# Patient Record
Sex: Male | Born: 1945 | Race: Black or African American | Hispanic: No | Marital: Single | Smoking: Current every day smoker
Health system: Southern US, Community
[De-identification: ages and names within clinical notes are randomized; demographics above are authoritative.]

## PROBLEM LIST (undated history)

## (undated) DIAGNOSIS — F419 Anxiety disorder, unspecified: Secondary | ICD-10-CM

## (undated) DIAGNOSIS — R531 Weakness: Secondary | ICD-10-CM

## (undated) DIAGNOSIS — M1990 Unspecified osteoarthritis, unspecified site: Secondary | ICD-10-CM

## (undated) DIAGNOSIS — F79 Unspecified intellectual disabilities: Secondary | ICD-10-CM

## (undated) DIAGNOSIS — K635 Polyp of colon: Secondary | ICD-10-CM

## (undated) DIAGNOSIS — K8689 Other specified diseases of pancreas: Secondary | ICD-10-CM

## (undated) DIAGNOSIS — S069X9A Unspecified intracranial injury with loss of consciousness of unspecified duration, initial encounter: Secondary | ICD-10-CM

## (undated) DIAGNOSIS — K56609 Unspecified intestinal obstruction, unspecified as to partial versus complete obstruction: Secondary | ICD-10-CM

## (undated) DIAGNOSIS — D649 Anemia, unspecified: Secondary | ICD-10-CM

## (undated) DIAGNOSIS — K8020 Calculus of gallbladder without cholecystitis without obstruction: Secondary | ICD-10-CM

## (undated) DIAGNOSIS — S069XAA Unspecified intracranial injury with loss of consciousness status unknown, initial encounter: Secondary | ICD-10-CM

## (undated) HISTORY — DX: Unspecified intellectual disabilities: F79

## (undated) HISTORY — DX: Anemia, unspecified: D64.9

## (undated) HISTORY — DX: Anxiety disorder, unspecified: F41.9

## (undated) HISTORY — DX: Unspecified intracranial injury with loss of consciousness status unknown, initial encounter: S06.9XAA

## (undated) HISTORY — DX: Polyp of colon: K63.5

## (undated) HISTORY — DX: Unspecified intestinal obstruction, unspecified as to partial versus complete obstruction: K56.609

## (undated) HISTORY — DX: Calculus of gallbladder without cholecystitis without obstruction: K80.20

## (undated) HISTORY — DX: Unspecified intracranial injury with loss of consciousness of unspecified duration, initial encounter: S06.9X9A

---
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
## 2000-02-01 ENCOUNTER — Emergency Department (HOSPITAL_COMMUNITY): Admission: EM | Admit: 2000-02-01 | Discharge: 2000-02-01 | Payer: Self-pay | Admitting: Emergency Medicine

## 2000-02-10 ENCOUNTER — Emergency Department (HOSPITAL_COMMUNITY): Admission: EM | Admit: 2000-02-10 | Discharge: 2000-02-10 | Payer: Self-pay | Admitting: Emergency Medicine

## 2001-02-05 ENCOUNTER — Emergency Department (HOSPITAL_COMMUNITY): Admission: EM | Admit: 2001-02-05 | Discharge: 2001-02-05 | Payer: Self-pay | Admitting: Emergency Medicine

## 2001-02-05 ENCOUNTER — Encounter: Payer: Self-pay | Admitting: Emergency Medicine

## 2006-10-07 ENCOUNTER — Ambulatory Visit (HOSPITAL_COMMUNITY): Admission: RE | Admit: 2006-10-07 | Discharge: 2006-10-07 | Payer: Self-pay | Admitting: Emergency Medicine

## 2006-10-07 ENCOUNTER — Emergency Department (HOSPITAL_COMMUNITY): Admission: EM | Admit: 2006-10-07 | Discharge: 2006-10-07 | Payer: Self-pay | Admitting: Emergency Medicine

## 2006-10-07 ENCOUNTER — Encounter: Payer: Self-pay | Admitting: Vascular Surgery

## 2009-11-20 HISTORY — PX: COLON SURGERY: SHX602

## 2010-06-01 ENCOUNTER — Emergency Department (HOSPITAL_COMMUNITY): Admission: EM | Admit: 2010-06-01 | Discharge: 2010-06-01 | Payer: Self-pay | Admitting: Emergency Medicine

## 2010-07-08 ENCOUNTER — Encounter: Admission: RE | Admit: 2010-07-08 | Discharge: 2010-07-08 | Payer: Self-pay | Admitting: Family Medicine

## 2010-08-03 ENCOUNTER — Encounter (INDEPENDENT_AMBULATORY_CARE_PROVIDER_SITE_OTHER): Payer: Self-pay | Admitting: *Deleted

## 2010-08-04 ENCOUNTER — Ambulatory Visit: Payer: Self-pay | Admitting: Oncology

## 2010-08-19 LAB — CBC WITH DIFFERENTIAL/PLATELET
BASO%: 0.4 % (ref 0.0–2.0)
EOS%: 3.7 % (ref 0.0–7.0)
HCT: 38.3 % — ABNORMAL LOW (ref 38.4–49.9)
LYMPH%: 17.4 % (ref 14.0–49.0)
MCH: 29.8 pg (ref 27.2–33.4)
MCHC: 32.9 g/dL (ref 32.0–36.0)
MCV: 90.7 fL (ref 79.3–98.0)
NEUT%: 72.1 % (ref 39.0–75.0)
Platelets: 498 10*3/uL — ABNORMAL HIGH (ref 140–400)

## 2010-08-19 LAB — CHCC SMEAR

## 2010-08-19 LAB — MORPHOLOGY: PLT EST: INCREASED

## 2010-08-22 ENCOUNTER — Encounter (INDEPENDENT_AMBULATORY_CARE_PROVIDER_SITE_OTHER): Payer: Self-pay | Admitting: *Deleted

## 2010-08-22 LAB — COMPREHENSIVE METABOLIC PANEL WITH GFR
ALT: 8 U/L (ref 0–53)
AST: 15 U/L (ref 0–37)
Albumin: 4.4 g/dL (ref 3.5–5.2)
Alkaline Phosphatase: 69 U/L (ref 39–117)
BUN: 10 mg/dL (ref 6–23)
CO2: 26 meq/L (ref 19–32)
Calcium: 9.5 mg/dL (ref 8.4–10.5)
Chloride: 106 meq/L (ref 96–112)
Creatinine, Ser: 0.87 mg/dL (ref 0.40–1.50)
Glucose, Bld: 90 mg/dL (ref 70–99)
Potassium: 4 meq/L (ref 3.5–5.3)
Sodium: 142 meq/L (ref 135–145)
Total Bilirubin: 0.5 mg/dL (ref 0.3–1.2)
Total Protein: 6.6 g/dL (ref 6.0–8.3)

## 2010-08-22 LAB — ANA: Anti Nuclear Antibody(ANA): NEGATIVE

## 2010-08-22 LAB — SEDIMENTATION RATE: Sed Rate: 7 mm/hr (ref 0–16)

## 2010-10-04 ENCOUNTER — Ambulatory Visit: Payer: Self-pay | Admitting: Internal Medicine

## 2010-10-04 DIAGNOSIS — D649 Anemia, unspecified: Secondary | ICD-10-CM | POA: Insufficient documentation

## 2010-10-26 ENCOUNTER — Encounter: Payer: Self-pay | Admitting: Internal Medicine

## 2010-10-26 ENCOUNTER — Ambulatory Visit (HOSPITAL_COMMUNITY)
Admission: RE | Admit: 2010-10-26 | Discharge: 2010-10-26 | Payer: Self-pay | Source: Home / Self Care | Admitting: Internal Medicine

## 2010-10-27 ENCOUNTER — Telehealth (INDEPENDENT_AMBULATORY_CARE_PROVIDER_SITE_OTHER): Payer: Self-pay

## 2010-10-27 DIAGNOSIS — D126 Benign neoplasm of colon, unspecified: Secondary | ICD-10-CM

## 2010-12-02 ENCOUNTER — Encounter: Payer: Self-pay | Admitting: Internal Medicine

## 2010-12-22 NOTE — Letter (Signed)
Summary: New Patient letter  [facility]  [address] [place], [place] [address]   Phone: [phone]  Fax: [phone]       08/03/2010 MRN: [id number]  [name] [address] [place], [address]  Dear [name],  Welcome to the Gastroenterology Division at [facility].    You are scheduled to see Dr.  Yancey Flemings on September 09, 2010 at 9:30am on the 3rd floor at [facility], [address].  We ask that you try to arrive at our office 15 minutes prior to your appointment time to allow for check-in.  We would like you to complete the enclosed self-administered evaluation form prior to your visit and bring it with you on the day of your appointment.  We will review it with you.  Also, please bring a complete list of all your medications or, if you prefer, bring the medication bottles and we will list them.  Please bring your insurance card so that we may make a copy of it.  If your insurance requires a referral to see a specialist, please bring your referral form from your primary care physician.  Co-payments are due at the time of your visit and may be paid by cash, check or credit card.     Your office visit will consist of a consult with your physician (includes a physical exam), any laboratory testing he/she may order, scheduling of any necessary diagnostic testing (e.g. x-ray, ultrasound, CT-scan), and scheduling of a procedure (e.g. Endoscopy, Colonoscopy) if required.  Please allow enough time on your schedule to allow for any/all of these possibilities.    If you cannot keep your appointment, please call [phone] to cancel or reschedule prior to your appointment date.  This allows [place] the opportunity to schedule an appointment for another patient in need of care.  If you do not cancel or reschedule by 5 p.m. the business day prior to your appointment date, you will be charged a $50.00 late cancellation/no-show fee.    Thank you for  choosing [facility] for your medical needs.  We appreciate the opportunity to care for you.  Please visit [place] at our website  to learn more about our practice.                     Sincerely,                                                             The Gastroenterology Division

## 2010-12-22 NOTE — Progress Notes (Signed)
Summary: Schedule Surgical referral  Phone Note Outgoing Call Call back at [facility] Phone [phone]   Call placed by: Darcey Nora RN, CGRN,  October 27, 2010 9:37 AM Call placed to: Patient Summary of Call: I have left the patient to call back to discuss surgical referral to Dr B Hoxworth at [facility] 11-25-10  2:20.  Per Dr Eda Keys patient is to see Dr Johna Sheriff Initial call taken by: Darcey Nora RN, CGRN,  October 27, 2010 9:38 AM  Follow-up for Phone Call        I have left another message for the patient to call back.  The number listed is his sister Nelva Bush.  She is listed in e-chart as his emergency contact. Follow-up by: Darcey Nora RN, CGRN,  October 28, 2010 11:15 AM  Additional Follow-up for Phone Call Additional follow up Details #1::        Left message for patient to call back Darcey Nora RN, [facility]  October 31, 2010 8:51 AM  I spoke with Nelva Bush and this appointment has been rescheduled for 12/02/09 11:00 Additional Follow-up by: Darcey Nora RN, CGRN,  October 31, 2010 10:03 AM  New Problems: COLONIC POLYPS (ICD-211.3)   New Problems: COLONIC POLYPS (ICD-211.3)

## 2010-12-22 NOTE — Letter (Signed)
Summary: New Patient letter  [facility]  [address] [place], [place] [address]   Phone: [phone]  Fax: [phone]       08/22/2010 MRN: [id number]  [facility] [address] [place], [place]  [address]  Dear Mr. BLEW,  Welcome to the Gastroenterology Division at [facility].    You are scheduled to see Dr.  Marina Goodell  on 10/04/10 at 11:00 am on the 3rd floor at [facility], [address].  We ask that you try to arrive at our office 15 minutes prior to your appointment time to allow for check-in.  We would like you to complete the enclosed self-administered evaluation form prior to your visit and bring it with you on the day of your appointment.  We will review it with you.  Also, please bring a complete list of all your medications or, if you prefer, bring the medication bottles and we will list them.  Please bring your insurance card so that we may make a copy of it.  If your insurance requires a referral to see a specialist, please bring your referral form from your primary care physician.  Co-payments are due at the time of your visit and may be paid by cash, check or credit card.     Your office visit will consist of a consult with your physician (includes a physical exam), any laboratory testing he/she may order, scheduling of any necessary diagnostic testing (e.g. x-ray, ultrasound, CT-scan), and scheduling of a procedure (e.g. Endoscopy, Colonoscopy) if required.  Please allow enough time on your schedule to allow for any/all of these possibilities.    If you cannot keep your appointment, please call [phone] to cancel or reschedule prior to your appointment date.  This allows [place] the opportunity to schedule an appointment for another patient in need of care.  If you do not cancel or reschedule by 5 p.m. the business day prior to your appointment date, you will be charged a $50.00 late cancellation/no-show fee.    Thank you for choosing  [facility] for your medical needs.  We appreciate the opportunity to care for you.  Please visit [place] at our website  to learn more about our practice.                     Sincerely,                                                             The Gastroenterology Division

## 2010-12-22 NOTE — Procedures (Signed)
Summary: Colonoscopy  Shelton: [name] Note: All result statuses are Final unless otherwise noted.  Tests: (1) Colonoscopy (COL)   COL Colonoscopy           DONE     [facility]     [address] [place], [place]  [address]           COLONOSCOPY PROCEDURE REPORT           [name], [name]  MR#:  [id number]     BIRTHDATE:  [birth year], 64 yrs. old  GENDER:  male     ENDOSCOPIST:  Wilhemina Bonito. Eda Keys, MD     REF. BY:  Ace Gins, M.D.     PROCEDURE DATE:  10/26/2010     PROCEDURE:  Colonoscopy with biopsy     ASA CLASS:  Class II     INDICATIONS:  family history of colon cancer ; sibling     MEDICATIONS:   Fentanyl 75 mcg IV, Versed 7 mg IV           DESCRIPTION OF PROCEDURE:   After Daniel risks benefits and     alternatives of Daniel procedure were thoroughly explained, informed     consent was obtained.  Digital rectal exam was performed and     revealed no abnormalities.   Daniel Pentax Colonoscope [id number]     endoscope was introduced through Daniel anus and advanced to Daniel     cecum, which was identified by Daniel ileocecal valve, without     limitations.  Daniel quality of Daniel prep was excellent, using     MiraLax.  Daniel instrument was then slowly withdrawn as Daniel colon     was fully examined.     <<PROCEDUREIMAGES>>     FINDINGS:  There were multiple (>30) polyps identified THROUGHOUT     Daniel ENTIRE COLON. MANY WERE PEDUNCULATED AND > 4-5CM IN SIZE.     OTHERS WERE SESSILE LARGE  AND INVOLVING MORE THAN ONE HAUSTRAL     FOLD. SEVERAL HAD CENTRAL UMBILICATION AND WERE NOT AMENABLE T     O ENDOSCOPIC REMOVAL. ONE IN Daniel DESCENDING COLON WAS BX. NO     POLYPS WERE REMOVED. Retroflexed views in Daniel rectum revealed no     abnormalities.    Daniel scope was then withdrawn from [name]     and Daniel procedure completed.           COMPLICATIONS:  None           ENDOSCOPIC IMPRESSION:     1) MULTIPLE POLYPS NOT REMOVED (SEE ABOVE)           RECOMMENDATIONS:     1)  My office will arrange for you to meet with a GENERAL SURGEON     RE "TOTAL COLECTOMY"           ______________________________     Wilhemina Bonito. Eda Keys, MD           CC:  Ace Gins, MD; Glenna Fellows, MD; [name]           n.     eSIGNED:   Wilhemina Bonito. Eda Keys at 10/26/2010 10:26 AM           [name], [id number]  Note: An exclamation mark (!) indicates a result that was not dispersed into Daniel flowsheet. Document Creation Date: 10/26/2010 10:26 AM _______________________________________________________________________  (1) Order result status: Final Collection or observation date-time: 10/26/2010  10:07 Requested date-time:  Receipt date-time:  Reported date-time:  Referring Physician:   Ordering Physician: Fransico Setters [phone]) Specimen Source:  Source: Launa Grill Order Number: [id number] Lab site:

## 2010-12-22 NOTE — Letter (Signed)
Summary: [facility] Instructions  [facility]  [address] [place], [place] [address]   Phone: [phone]  Fax: [phone]       WAYLON HERSHEY    03/27/1946    MRN: [id number]       Procedure Day /Date:_WEDNESDAY   10/26/10     Arrival Time: 8:00 AM     Procedure Time:9:00 AM     Location of Procedure:                     [facility] ( Outpatient Registration)      PREPARATION FOR COLONOSCOPY WITH MIRALAX  Starting 5 days prior to your procedure 10/21/10 do not eat nuts, seeds, popcorn, corn, beans, peas,  salads, or any raw vegetables.  Do not take any fiber supplements (e.g. Metamucil, Citrucel, and Benefiber). ____________________________________________________________________________________________________   THE DAY BEFORE YOUR PROCEDURE         DATE: 10/25/10 DAY: TUESDAY  1   Drink clear liquids the entire day-NO SOLID FOOD  2   Do not drink anything colored red or purple.  Avoid juices with pulp.  No orange juice.  3   Drink at least 64 oz. (8 glasses) of fluid/clear liquids during the day to prevent dehydration and help the prep work efficiently.  CLEAR LIQUIDS INCLUDE: Water Jello Ice Popsicles Tea (sugar ok, no milk/cream) Powdered fruit flavored drinks Coffee (sugar ok, no milk/cream) Gatorade Juice: apple, white grape, white cranberry  Lemonade Clear bullion, consomm, broth Carbonated beverages (any kind) Strained chicken noodle soup Hard Candy  4   Mix the entire bottle of Miralax with 64 oz. of Gatorade/Powerade in the morning and put in the refrigerator to chill.  5   At 3:00 pm take 2 Dulcolax/Bisacodyl tablets.  6   At 4:30 pm take one Reglan/Metoclopramide tablet.  7  Starting at 5:00 pm drink one 8 oz glass of the Miralax mixture every 15-20 minutes until you have finished drinking the entire 64 oz.  You should finish drinking prep around 7:30 or 8:00 pm.  8   If you are nauseated, you may take the 2nd  Reglan/Metoclopramide tablet at 6:30 pm.        9    At 8:00 pm take 2 more DULCOLAX/Bisacodyl tablets.     THE DAY OF YOUR PROCEDURE      DATE: 10/26/10 DAY: Lulu Riding  You may drink clear liquids until 5:00 AM (4HOURS BEFORE PROCEDURE).   MEDICATION INSTRUCTIONS  Unless otherwise instructed, you should take regular prescription medications with a small sip of water as early as possible the morning of your procedure.  HOLD IRON 1 WEEK PRIOR TO YOUR PROCEDURE.         OTHER INSTRUCTIONS  You will need a responsible adult at least 65 years of age to accompany you and drive you home.   This person must remain in the waiting room during your procedure.  Wear loose fitting clothing that is easily removed.  Leave jewelry and other valuables at home.  However, you may wish to bring a book to read or an iPod/MP3 player to listen to music as you wait for your procedure to start.  Remove all body piercing jewelry and leave at home.  Total time from sign-in until discharge is approximately 2-3 hours.  You should go home directly after your procedure and rest.  You can resume normal activities the day after your procedure.  The day of your procedure you  should not:   Drive   Make legal decisions   Operate machinery   Drink alcohol   Return to work  You will receive specific instructions about eating, activities and medications before you leave.   The above instructions have been reviewed and explained to me by   _______________________    I fully understand and can verbalize these instructions _____________________________ Date _______

## 2010-12-22 NOTE — Assessment & Plan Note (Signed)
Summary: Anemia, family history of colon cancer   History of Present Illness Visit Type: Initial Consult Primary GI MD: Yancey Flemings MD Primary Tahja Liao: Ace Gins, MD Requesting Clairissa Valvano: Ace Gins, MD Chief Complaint: Patient here for further evaluation of anemia. He denies any current GI complaints. History of Present Illness:   65 year old with a history of anxiety disorder and alcoholism. He sent today because of mild anemia and the need for colonoscopy. He is accompanied by his sister. Patient appears to have psychosocial deficiencies and is a poor historian. His sister tells me that they have a brother who was diagnosed with colon cancer in his 55s. This patient has never had colonoscopy, though other family members have been screened. Patient denies any GI complaints. There's been no evidence of bleeding. Review of outside laboratories finds minimal normocytic anemia with a hemoglobin of 11.9. B12 and iron studies were normal.   GI Review of Systems      Denies abdominal pain, acid reflux, belching, bloating, chest pain, dysphagia with liquids, dysphagia with solids, heartburn, loss of appetite, nausea, vomiting, vomiting blood, weight loss, and  weight gain.        Denies anal fissure, black tarry stools, change in bowel habit, constipation, diarrhea, diverticulosis, fecal incontinence, heme positive stool, hemorrhoids, irritable bowel syndrome, jaundice, light color stool, liver problems, rectal bleeding, and  rectal pain. Preventive Screening-Counseling & Management  Caffeine-Diet-Exercise     Does Patient Exercise: yes    Current Medications (verified): 1)  Multivitamins  Tabs (Multiple Vitamin) .Marland Kitchen.. 1 By Mouth Once Daily 2)  Ferrous Sulfate 325 (65 Fe) Mg Tabs (Ferrous Sulfate) .Marland Kitchen.. 1 By Mouth Once Daily  Allergies (verified): 1)  ! Pcn  Past History:  Past Medical History: Reviewed history from 09/30/2010 and no changes required. Anemia Anxiety  Disorder Hx. of ETOH Dependence quit 7/11 Family Hx of Colon Ca. and colon polyps  Past Surgical History: Unremarkable  Family History: Family History of Colon Cancer: brother died Family History of Colon Polyps: Sister Family History of Heart Disease: Mother died age 49 MI vs CVA Family History of Heart Disease: Father MI Family History of Diabetes:   Social History: Patient currently smokes. 1/2 PPD Quit Alcohol 7/11  strong hx of---now drinks up to 3 40oz beers daily (as of 10-04-10) Occupation: disabled Illicit Drug Use - no Single lives with sister Patient gets regular exercise. Does Patient Exercise:  yes  Review of Systems  The patient denies allergy/sinus, anemia, anxiety-new, arthritis/joint pain, back pain, blood in urine, breast changes/lumps, change in vision, confusion, cough, coughing up blood, depression-new, fainting, fatigue, fever, headaches-new, hearing problems, heart murmur, heart rhythm changes, itching, menstrual pain, muscle pains/cramps, night sweats, nosebleeds, pregnancy symptoms, shortness of breath, skin rash, sleeping problems, sore throat, swelling of feet/legs, swollen lymph glands, thirst - excessive , urination - excessive , urination changes/pain, urine leakage, vision changes, and voice change.    Vital Signs:  Patient profile:   65 year old male Height:      67.5 inches Weight:      124.50 pounds BMI:     19.28 BSA:     1.66 Pulse rate:   64 / minute Pulse rhythm:   regular BP sitting:   122 / 66  (right arm)  Vitals Entered By: Lamona Curl CMA Duncan Dull) (October 04, 2010 11:26 AM)  Physical Exam  General:  then generally healthy-appearing black male in no acute distress Head:  Normocephalic and atraumatic. Eyes:  cataracts Nose:  No deformity,  discharge,  or lesions. Mouth:  No deformity or lesions. Neck:  Supple; no masses or thyromegaly. Lungs:  Clear throughout to auscultation. Heart:  Regular rate and rhythm; no murmurs,  rubs,  or bruits. Abdomen:  Soft, nontender and nondistended. No masses, hepatosplenomegaly or hernias noted. Normal bowel sounds. Rectal:  deferred until colonoscopy Prostate:  deferred until colonoscopy Msk:  Symmetrical with no gross deformities. Normal posture. Pulses:  Normal pulses noted. Extremities:  No clubbing, cyanosis, edema or deformities noted. Neurologic:  Alert and  oriented x4 Skin:  Intact without significant lesions or rashes. Psych:  Alert and cooperative.  alooof   Impression & Recommendations:  Problem # 1:  UNSPECIFIED ANEMIA (ICD-285.9) mild normocytic anemia with no evidence of iron deficiency or GI bleeding. Normal B12. Recommend the primary care physician monitor his blood counts periodically. If patient develops progressive anemia without bleeding or iron deficiency, consider hematology consultation  Problem # 2:  FM HX MALIGNANT NEOPLASM GASTROINTESTINAL TRACT (ICD-V16.0) brother with a history of colon cancer or in his early 23s. The patient is an appropriate candidate for screening colonoscopy without contraindication. The nature of the procedure as well as the risks, benefits, and alternatives were carefully reviewed with the patient and his sister. They understood and agreed to proceed. MiraLax prep prescribed. The patient will be scheduled at the hospital.  Other Orders: ZCOL (ZCOL)  Patient Instructions: 1)  Colonoscopy [facility] 10/26/10 9:00 am arrive at 8:00 am 2)  Hold Iron x 1 week prior to procedure. 3)  Miralax prep instructions given and Rx. printed and given to patient. 4)  Colonoscopy and Flexible Sigmoidoscopy brochure given.  5)  Copy sent to : Ace Gins, MD 6)  The medication list was reviewed and reconciled.  All changed / newly prescribed medications were explained.  A complete medication list was provided to the patient / caregiver. Prescriptions: MIRALAX   POWD (POLYETHYLENE GLYCOL 3350) As per prep  instructions.  #255gm x 0   Entered  by:   Milford Cage NCMA   Authorized by:   Hilarie Fredrickson MD   Signed by:   Milford Cage NCMA on 10/04/2010   Method used:   Print then Give to Patient   RxID:   [id number] METOCLOPRAMIDE HCL 10 MG  TABS (METOCLOPRAMIDE HCL) As per prep instructions.  #2 x 0   Entered by:   Milford Cage NCMA   Authorized by:   Hilarie Fredrickson MD   Signed by:   Milford Cage NCMA on 10/04/2010   Method used:   Print then Give to Patient   RxID:   [id number] DULCOLAX 5 MG  TBEC (BISACODYL) Day before procedure take 2 at 3pm and 2 at 8pm.  #4 x 0   Entered by:   Milford Cage NCMA   Authorized by:   Hilarie Fredrickson MD   Signed by:   Milford Cage NCMA on 10/04/2010   Method used:   Print then Give to Patient   RxID:   [id number]

## 2010-12-22 NOTE — Consult Note (Signed)
Summary: [facility]   Imported By: Sherian Rein 12/14/2010 12:40:32  _____________________________________________________________________  External Attachment:    Type:   Image     Comment:   External Document

## 2010-12-22 NOTE — Procedures (Signed)
Summary: Instructions for procedure/[place]  Instructions for procedure/[place]   Imported By: Sherian Rein 10/07/2010 14:40:22  _____________________________________________________________________  External Attachment:    Type:   Image     Comment:   External Document

## 2011-01-04 ENCOUNTER — Encounter (HOSPITAL_COMMUNITY): Payer: Medicare Other

## 2011-01-04 ENCOUNTER — Other Ambulatory Visit: Payer: Self-pay | Admitting: General Surgery

## 2011-01-04 DIAGNOSIS — Z01810 Encounter for preprocedural cardiovascular examination: Secondary | ICD-10-CM | POA: Insufficient documentation

## 2011-01-04 DIAGNOSIS — Z01812 Encounter for preprocedural laboratory examination: Secondary | ICD-10-CM | POA: Insufficient documentation

## 2011-01-04 LAB — COMPREHENSIVE METABOLIC PANEL
AST: 17 U/L (ref 0–37)
Albumin: 4.1 g/dL (ref 3.5–5.2)
Calcium: 9.8 mg/dL (ref 8.4–10.5)
Chloride: 104 mEq/L (ref 96–112)
Creatinine, Ser: 0.92 mg/dL (ref 0.4–1.5)
GFR calc Af Amer: >60 mL/min (ref >60)
Sodium: 141 mEq/L (ref 135–145)
Total Bilirubin: 0.8 mg/dL (ref 0.3–1.2)

## 2011-01-04 LAB — CBC
MCH: 30.1 pg (ref 26.0–34.0)
Platelets: 417 10*3/uL — ABNORMAL HIGH (ref 150–400)
RBC: 4.35 MIL/uL (ref 4.22–5.81)

## 2011-01-04 LAB — URINALYSIS, ROUTINE W REFLEX MICROSCOPIC
Urine Glucose, Fasting: NEGATIVE mg/dL
pH: 6 (ref 5.0–8.0)

## 2011-01-04 LAB — DIFFERENTIAL
Basophils Absolute: 0.1 10*3/uL (ref 0.0–0.1)
Basophils Relative: 1 % (ref 0–1)
Eosinophils Absolute: 0.2 10*3/uL (ref 0.0–0.7)
Monocytes Relative: 5 % (ref 3–12)
Neutrophils Relative %: 69 % (ref 43–77)

## 2011-01-04 LAB — URINE MICROSCOPIC-ADD ON

## 2011-01-05 LAB — CEA: CEA: 2.3 ng/mL (ref 0.0–5.0)

## 2011-01-07 LAB — MRSA CULTURE

## 2011-01-11 ENCOUNTER — Inpatient Hospital Stay (HOSPITAL_COMMUNITY)
Admission: RE | Admit: 2011-01-11 | Discharge: 2011-01-19 | DRG: 330 | Disposition: A | Discharge status: Home Health | Payer: Medicare Other | Source: Ambulatory Visit | Attending: General Surgery | Admitting: General Surgery

## 2011-01-11 ENCOUNTER — Other Ambulatory Visit: Payer: Self-pay | Admitting: General Surgery

## 2011-01-11 ENCOUNTER — Encounter (INDEPENDENT_AMBULATORY_CARE_PROVIDER_SITE_OTHER): Payer: Self-pay | Admitting: *Deleted

## 2011-01-11 DIAGNOSIS — L02219 Cutaneous abscess of trunk, unspecified: Secondary | ICD-10-CM | POA: Diagnosis not present

## 2011-01-11 DIAGNOSIS — D126 Benign neoplasm of colon, unspecified: Principal | ICD-10-CM | POA: Diagnosis present

## 2011-01-11 DIAGNOSIS — J4489 Other specified chronic obstructive pulmonary disease: Secondary | ICD-10-CM | POA: Diagnosis present

## 2011-01-11 DIAGNOSIS — Z9889 Other specified postprocedural states: Secondary | ICD-10-CM

## 2011-01-11 DIAGNOSIS — Z87820 Personal history of traumatic brain injury: Secondary | ICD-10-CM

## 2011-01-11 DIAGNOSIS — R197 Diarrhea, unspecified: Secondary | ICD-10-CM | POA: Diagnosis not present

## 2011-01-11 DIAGNOSIS — F17200 Nicotine dependence, unspecified, uncomplicated: Secondary | ICD-10-CM | POA: Diagnosis present

## 2011-01-11 DIAGNOSIS — J449 Chronic obstructive pulmonary disease, unspecified: Secondary | ICD-10-CM | POA: Diagnosis present

## 2011-01-11 DIAGNOSIS — R269 Unspecified abnormalities of gait and mobility: Secondary | ICD-10-CM | POA: Diagnosis present

## 2011-01-11 DIAGNOSIS — D509 Iron deficiency anemia, unspecified: Secondary | ICD-10-CM | POA: Diagnosis present

## 2011-01-11 DIAGNOSIS — L03319 Cellulitis of trunk, unspecified: Secondary | ICD-10-CM | POA: Diagnosis not present

## 2011-01-11 DIAGNOSIS — D128 Benign neoplasm of rectum: Secondary | ICD-10-CM | POA: Diagnosis present

## 2011-01-11 LAB — TYPE AND SCREEN
ABO/RH(D): A POS
Antibody Screen: NEGATIVE

## 2011-01-11 LAB — HEMOGLOBIN AND HEMATOCRIT, BLOOD: HCT: 33.9 % — ABNORMAL LOW (ref 39.0–52.0)

## 2011-01-12 LAB — BASIC METABOLIC PANEL
BUN: 5 mg/dL — ABNORMAL LOW (ref 6–23)
Calcium: 8.1 mg/dL — ABNORMAL LOW (ref 8.4–10.5)
Creatinine, Ser: 0.82 mg/dL (ref 0.4–1.5)
GFR calc non Af Amer: >60 mL/min (ref >60)
Glucose, Bld: 155 mg/dL — ABNORMAL HIGH (ref 70–99)

## 2011-01-12 LAB — CBC
HCT: 32.2 % — ABNORMAL LOW (ref 39.0–52.0)
MCH: 29.3 pg (ref 26.0–34.0)
MCHC: 32.3 g/dL (ref 30.0–36.0)
MCV: 90.7 fL (ref 78.0–100.0)
RDW: 15.2 % (ref 11.5–15.5)

## 2011-01-13 LAB — CBC
MCH: 29.6 pg (ref 26.0–34.0)
MCHC: 32.4 g/dL (ref 30.0–36.0)
RDW: 15.4 % (ref 11.5–15.5)

## 2011-01-14 LAB — CBC
Platelets: 309 10*3/uL (ref 150–400)
RBC: 3.56 MIL/uL — ABNORMAL LOW (ref 4.22–5.81)
RDW: 14.8 % (ref 11.5–15.5)
WBC: 15.3 10*3/uL — ABNORMAL HIGH (ref 4.0–10.5)

## 2011-01-16 LAB — DIFFERENTIAL
Basophils Absolute: 0 10*3/uL (ref 0.0–0.1)
Eosinophils Absolute: 0.6 10*3/uL (ref 0.0–0.7)
Lymphocytes Relative: 9 % — ABNORMAL LOW (ref 12–46)
Lymphs Abs: 1.4 10*3/uL (ref 0.7–4.0)
Neutrophils Relative %: 78 % — ABNORMAL HIGH (ref 43–77)

## 2011-01-16 LAB — CBC
MCV: 89.5 fL (ref 78.0–100.0)
Platelets: 371 10*3/uL (ref 150–400)
RBC: 3.34 MIL/uL — ABNORMAL LOW (ref 4.22–5.81)
RDW: 14.8 % (ref 11.5–15.5)
WBC: 15.7 10*3/uL — ABNORMAL HIGH (ref 4.0–10.5)
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
## 2011-01-17 LAB — CBC
HCT: 29.3 % — ABNORMAL LOW (ref 39.0–52.0)
Hemoglobin: 9.7 g/dL — ABNORMAL LOW (ref 13.0–17.0)
MCHC: 33.1 g/dL (ref 30.0–36.0)
RBC: 3.3 MIL/uL — ABNORMAL LOW (ref 4.22–5.81)

## 2011-01-18 LAB — CBC
HCT: 30.3 % — ABNORMAL LOW (ref 39.0–52.0)
Hemoglobin: 10.1 g/dL — ABNORMAL LOW (ref 13.0–17.0)
MCH: 29.4 pg (ref 26.0–34.0)
MCV: 88.1 fL (ref 78.0–100.0)
Platelets: 466 10*3/uL — ABNORMAL HIGH (ref 150–400)
RBC: 3.44 MIL/uL — ABNORMAL LOW (ref 4.22–5.81)
WBC: 14.4 10*3/uL — ABNORMAL HIGH (ref 4.0–10.5)

## 2011-01-18 LAB — BASIC METABOLIC PANEL
BUN: 12 mg/dL (ref 6–23)
CO2: 25 mEq/L (ref 19–32)
Chloride: 104 mEq/L (ref 96–112)
Creatinine, Ser: 0.72 mg/dL (ref 0.4–1.5)
Glucose, Bld: 89 mg/dL (ref 70–99)
Potassium: 4.1 mEq/L (ref 3.5–5.1)

## 2011-01-19 LAB — CBC
HCT: 30.9 % — ABNORMAL LOW (ref 39.0–52.0)
MCH: 29.4 pg (ref 26.0–34.0)
MCHC: 33.3 g/dL (ref 30.0–36.0)
MCV: 88.3 fL (ref 78.0–100.0)
Platelets: 523 10*3/uL — ABNORMAL HIGH (ref 150–400)
RDW: 14.5 % (ref 11.5–15.5)
WBC: 12.6 10*3/uL — ABNORMAL HIGH (ref 4.0–10.5)
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
## 2011-01-26 NOTE — Miscellaneous (Signed)
Summary: Path Report  SP-Surgical Pathology - STATUS: Final  .                                         Perform Date: 22Feb12 00:01  Ordered By: Hoy Finlay MD , PROVIDER         Ordered Date:  Facility: [facility]                              Department: CPATH  Service Report Text  [facility]   [address]   [place] , [place] [address]   Telephone : [phone] Fax : [phone]    REPORT OF SURGICAL PATHOLOGY    Accession #: [id number]   Patient Name: [name], [name]   Visit # : [id number]    MRN: [id number]   Physician: Glenna Fellows   DOB[birth year]Age [birth year] (Age: 65) Gender: M   Collected Date: 01/11/2011   Received Date: 01/12/2011    FINAL DIAGNOSIS    1. Colon, segmental resection for tumor, total abdominal :   - TUBULAR AND TUBULOVILLOUS ADENOMAS (TWENTY POLYPS).   - NO EVIDENCE OF HIGH GRADE GLANDULAR DYSPLASIA OR INVASIVE CARCINOMA   - NO ADENOMATOUS EPITHELIUM SEEN AT SURGICAL MARGINS OF RESECTION   - BENIGN PERICOLONIC LYMPH NODES (0/24)   - BENIGN APPENDIX AND TERMINAL ILEUM    2. Rectum, resection, distal margin :   - TUBULOVILLOUS ADENOMA WITH A SMALL FOCUS OF HIGH GRADE GLANDULAR DYSPLASIA   - NO INVASIVE CARCINOMA IDENTIFIED.   - NO ADENOMATOUS EPITHELIUM PRESENT AT THE DISTAL SURGICAL MARGIN OF RESECTION   - NO TUMOR IDENTIFIED IN TWO PERIRECTAL LYMPH NODES.    ELECTRONIC SIGNATURE : Smir Md, Bassam, Sports administrator, International aid/development worker    MICROSCOPIC DESCRIPTION    CASE COMMENTS    CLINICAL HISTORY    SPECIMEN(S) OBTAINED   1. Colon, segmental resection for tumor, Total Abdominal   2. Rectum, resection, Distal Margin    SPECIMEN COMMENTS:   SPECIMEN CLINICAL INFORMATION:   1. Colon polyps (las)    Gross Description   1. Received in formalin is a segment of intestine which includes 6 cm of   terminal ileum and 108 cm of colon.The serosa is smooth and tan.There are 20   rubbery, tan red, sessile mucosal polyps varying in size  from 0.3 to 3 cm in   greatest dimension.The polyps are all superficial, without gross evidence of   invasion.The polyps are located 28 cm from the proximal margin and 7 cm from the   distal margin.The uninvolved mucosa is glistening and tan.The appendix is   present and measures 7 cm in length x 0.7 cm in diameter.There are 24 rubbery,   tan, ovoid nodules, tentatively identified as lymph nodes, varying in size from   0.3 to 1 cm in greatest dimension.Sections are submitted in 29 cassettes.   A = proximal margin.   B = distal margin.   C = three polyps.   D = two polyps.   E = two polyps.   F = two polyps.   G = two polyps.   H = one sectioned polyp.   I = one polyp.   J = one sectioned polyp.   K, L = one sectioned polyp.   M, N = one sectioned polyp.   O, P =  one sectioned polyp.   Q, R = one sectioned polyp.   S, T, U = one sectioned polyp.   V, W, X = one sectioned polyp.   Y = appendix.   Z - ZC = lymph nodes.   2. Received in formalin is a 3.5 cm, previously partially opened portion of   colon, clinically further rectal distal margin.There is a suture attached to the   new distal margin.There is a 2.5 x 1.5 x 1.2 cm sessile, rubbery, tan red   mucosal polyp along the edge opposite the new margin.The polyp is located 2.5 cm  from the new distal margin.The uninvolved mucosa is glistening and tan.Within   the attached fat there are two rubbery tan nodules grossly consistent with lymph   nodes, measuring 0.4 and 0.7 cm in greatest dimension.Sections are submitted in   five cassettes.   A = sections from new distal margin.   B, C, D = entire polyp.   E = lymph nodes. GP/eps 01/12/11    Report signed out from the following location(s)   [place]   [address], [place] [address] CLIA #: [id number]    [facility]   [address] [place], [place] [address] CLIA #: [id number]  Additional Information  [id number] RESULT STATUS : F   External IF Update Timestamp : 2011-01-16:10:35:00.000000 Clinical Lists Changes

## 2011-02-05 LAB — DIFFERENTIAL
Eosinophils Absolute: 0.2 10*3/uL (ref 0.0–0.7)
Lymphs Abs: 2.8 10*3/uL (ref 0.7–4.0)
Monocytes Relative: 6 % (ref 3–12)
Neutro Abs: 11.9 10*3/uL — ABNORMAL HIGH (ref 1.7–7.7)
Neutrophils Relative %: 74 % (ref 43–77)

## 2011-02-05 LAB — COMPREHENSIVE METABOLIC PANEL
BUN: 11 mg/dL (ref 6–23)
CO2: 31 mEq/L (ref 19–32)
Calcium: 9.6 mg/dL (ref 8.4–10.5)
Creatinine, Ser: 0.91 mg/dL (ref 0.4–1.5)
GFR calc non Af Amer: >60 mL/min (ref >60)
Glucose, Bld: 113 mg/dL — ABNORMAL HIGH (ref 70–99)
Sodium: 146 mEq/L — ABNORMAL HIGH (ref 135–145)
Total Protein: 7 g/dL (ref 6.0–8.3)

## 2011-02-05 LAB — ETHANOL: Alcohol, Ethyl (B): <5 mg/dL (ref 0–10)

## 2011-02-05 LAB — CBC
HCT: 36.5 % — ABNORMAL LOW (ref 39.0–52.0)
Hemoglobin: 12.3 g/dL — ABNORMAL LOW (ref 13.0–17.0)
MCH: 30 pg (ref 26.0–34.0)
MCHC: 33.6 g/dL (ref 30.0–36.0)
MCV: 89.2 fL (ref 78.0–100.0)
RDW: 17.8 % — ABNORMAL HIGH (ref 11.5–15.5)

## 2012-06-02 IMAGING — CR DG PELVIS 1-2V
1 series · 1 of 1 positions shown · non-contrast
Comparison: None.

CLINICAL DATA: Lower back pain and weakness.

PELVIS - 1-2 VIEW

[t pelvis a.p.]
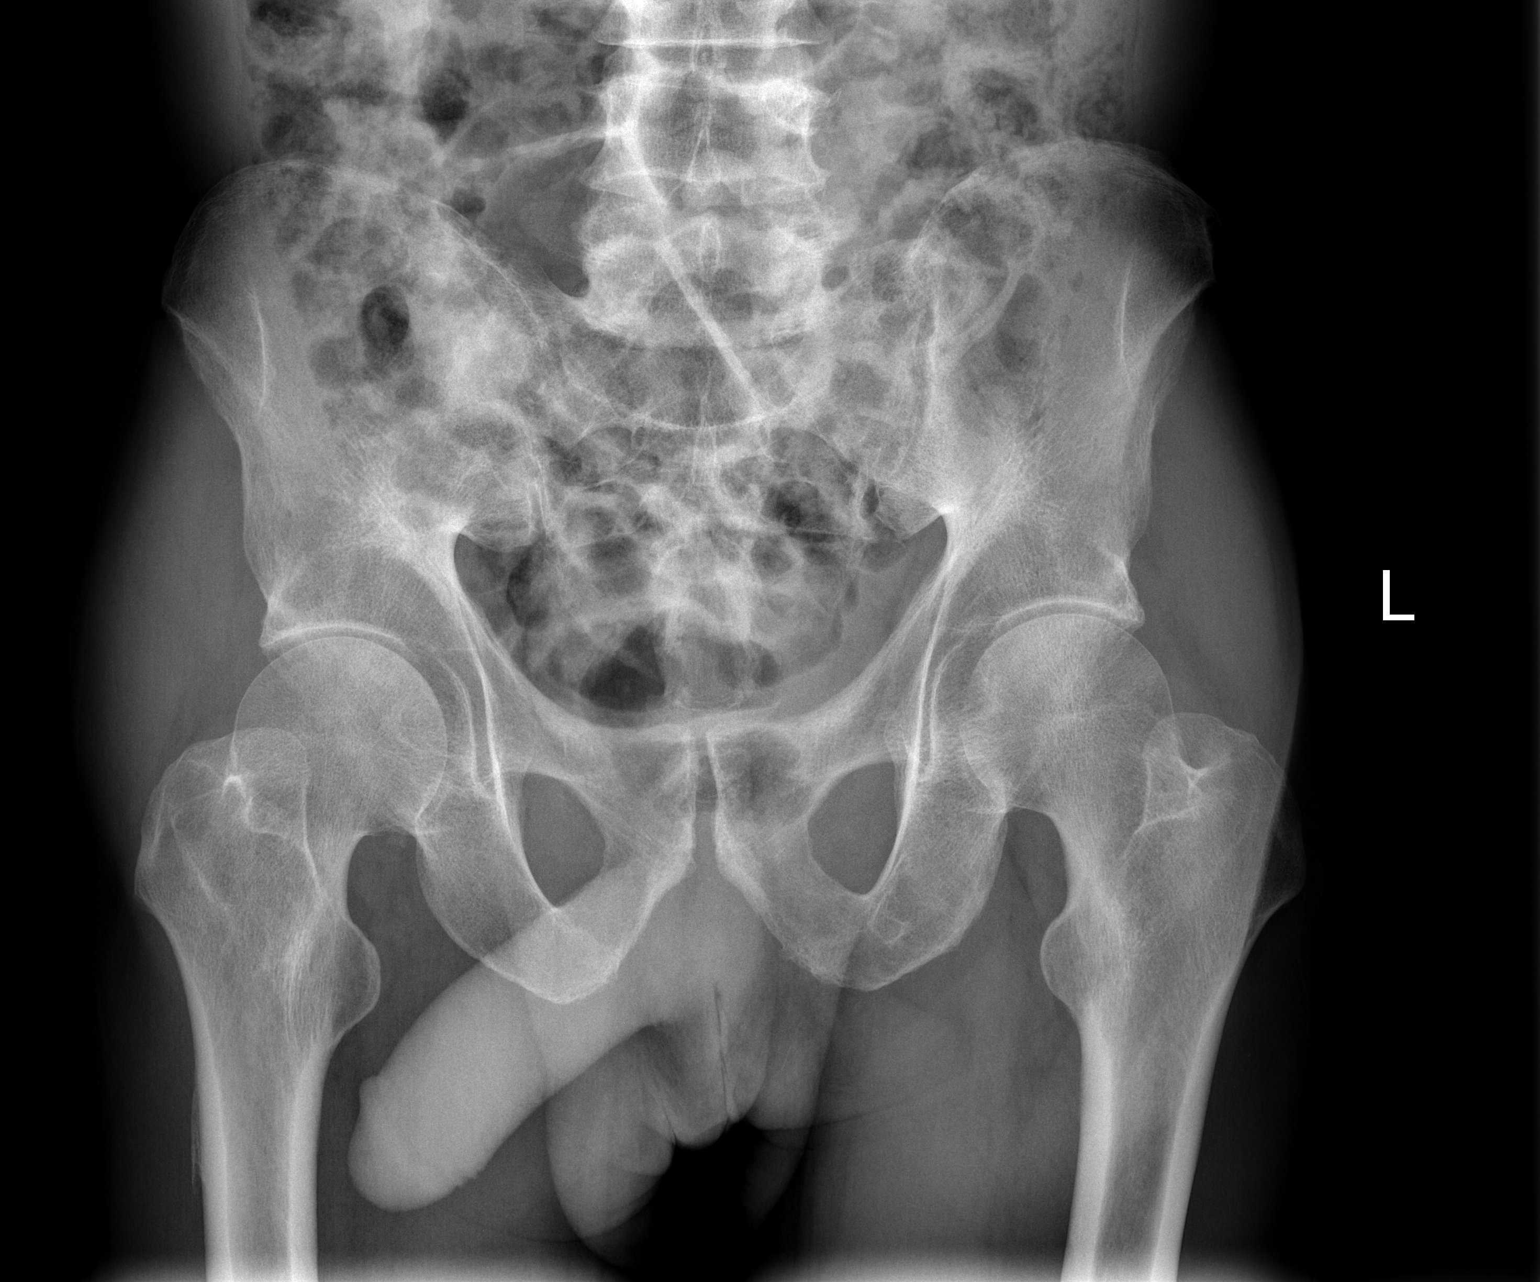

[1 of 1 positions shown; findings below may reference images not displayed]

FINDINGS: There is no evidence of fracture or dislocation.  Both
femoral heads are seated normally within their respective
acetabula. The lower lumbar spine appears grossly intact, and is
better assessed on the concurrent lumbar spine radiographs.

The visualized bowel gas pattern is grossly unremarkable in
appearance.
IMPRESSION: No evidence of fracture or dislocation.

## 2014-04-23 ENCOUNTER — Other Ambulatory Visit: Payer: Self-pay | Admitting: Internal Medicine

## 2014-04-23 ENCOUNTER — Ambulatory Visit
Admission: RE | Admit: 2014-04-23 | Discharge: 2014-04-23 | Disposition: A | Discharge status: Home / Self Care | Payer: Medicare Other | Source: Ambulatory Visit | Attending: Internal Medicine | Admitting: Internal Medicine

## 2014-04-23 DIAGNOSIS — R109 Unspecified abdominal pain: Secondary | ICD-10-CM

## 2014-04-24 ENCOUNTER — Other Ambulatory Visit: Payer: Self-pay | Admitting: Internal Medicine

## 2014-04-24 DIAGNOSIS — R109 Unspecified abdominal pain: Secondary | ICD-10-CM

## 2014-04-30 ENCOUNTER — Encounter: Payer: Self-pay | Admitting: Nurse Practitioner

## 2014-05-08 ENCOUNTER — Encounter: Payer: Self-pay | Admitting: Nurse Practitioner

## 2014-05-08 ENCOUNTER — Ambulatory Visit (INDEPENDENT_AMBULATORY_CARE_PROVIDER_SITE_OTHER): Payer: Medicare Other | Admitting: Nurse Practitioner

## 2014-05-08 VITALS — BP 100/58 | HR 66 | Ht 67.5 in | Wt 117.4 lb

## 2014-05-08 DIAGNOSIS — D126 Benign neoplasm of colon, unspecified: Secondary | ICD-10-CM

## 2014-05-08 DIAGNOSIS — K56609 Unspecified intestinal obstruction, unspecified as to partial versus complete obstruction: Secondary | ICD-10-CM

## 2014-05-08 DIAGNOSIS — R933 Abnormal findings on diagnostic imaging of other parts of digestive tract: Secondary | ICD-10-CM

## 2014-05-08 NOTE — Patient Instructions (Signed)
Call us if you  have any abdominal pain or nausea & vomiting.  [phone].
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
## 2014-05-09 DIAGNOSIS — K56609 Unspecified intestinal obstruction, unspecified as to partial versus complete obstruction: Secondary | ICD-10-CM | POA: Insufficient documentation

## 2014-05-09 DIAGNOSIS — R933 Abnormal findings on diagnostic imaging of other parts of digestive tract: Secondary | ICD-10-CM | POA: Insufficient documentation

## 2014-05-12 NOTE — Progress Notes (Signed)
Agree with assessment and plan as outlined. Linda, (1) set the patient up for MRI of the liver with and without contrast to "evaluate liver lesion on CT". Then, (2) have patient followup with me in the office. I suspect that he will need a flexible sigmoidoscopy to evaluate his residual rectal mucosa. He should have a family member with him. Thanks

## 2014-05-13 ENCOUNTER — Other Ambulatory Visit: Payer: Self-pay

## 2014-05-13 DIAGNOSIS — K769 Liver disease, unspecified: Secondary | ICD-10-CM

## 2014-05-13 NOTE — Progress Notes (Signed)
Spoke with Dr. Henrene Pastor and per Dr. Henrene Pastor Pt scheduled for CT of abd with and without, liver protocol 05/18/14@[place]  CT. Pt to be NPO after midnight, arrive at CT at 2pm to drink contrast, CT at 2:30pm. Spoke with pts sister and she is aware.

## 2014-05-13 NOTE — Progress Notes (Signed)
Spoke with Radiology and per the Radiologist the lesion is big enough that a Liver protocol CT with and without could be ordered. Do you want to proceed with this order?

## 2014-05-13 NOTE — Progress Notes (Signed)
Spoke with pts sister and she does not think pt can have the MRI due to a nervous condition that he has, states he shakes and cannot lay still. Dr. Henrene Pastor please advise.

## 2014-05-18 ENCOUNTER — Ambulatory Visit (HOSPITAL_COMMUNITY): Payer: Medicare Other

## 2014-05-18 ENCOUNTER — Ambulatory Visit (INDEPENDENT_AMBULATORY_CARE_PROVIDER_SITE_OTHER)
Admission: RE | Admit: 2014-05-18 | Discharge: 2014-05-18 | Disposition: A | Discharge status: Home / Self Care | Payer: Medicare Other | Source: Ambulatory Visit | Attending: Internal Medicine | Admitting: Internal Medicine

## 2014-05-18 DIAGNOSIS — K769 Liver disease, unspecified: Secondary | ICD-10-CM

## 2014-05-18 DIAGNOSIS — K7689 Other specified diseases of liver: Secondary | ICD-10-CM

## 2014-05-18 MED ORDER — IOHEXOL 350 MG/ML SOLN
100.0000 mL | Freq: Once | INTRAVENOUS | Status: AC | PRN
  Administered 2014-05-18: 80 mL via INTRAVENOUS

## 2014-05-19 ENCOUNTER — Other Ambulatory Visit: Payer: Self-pay

## 2014-05-19 ENCOUNTER — Encounter (HOSPITAL_COMMUNITY): Payer: Self-pay | Admitting: Pharmacy Technician

## 2014-05-19 DIAGNOSIS — K8689 Other specified diseases of pancreas: Secondary | ICD-10-CM

## 2014-05-19 NOTE — Progress Notes (Signed)
EUS scheduled, pt instructed and medications reviewed.  Patient instructions mailed to home.  Patient to call with any questions or concerns.  

## 2014-05-28 ENCOUNTER — Encounter (HOSPITAL_COMMUNITY): Payer: Self-pay | Admitting: *Deleted

## 2014-06-01 ENCOUNTER — Other Ambulatory Visit: Payer: Medicare Other

## 2014-06-01 DIAGNOSIS — K8689 Other specified diseases of pancreas: Secondary | ICD-10-CM

## 2014-06-02 LAB — CANCER ANTIGEN 19-9: CA 19-9: 20.6 U/mL (ref <35.0)

## 2014-06-04 ENCOUNTER — Telehealth: Payer: Self-pay

## 2014-06-04 ENCOUNTER — Telehealth: Payer: Self-pay | Admitting: Internal Medicine

## 2014-06-04 ENCOUNTER — Encounter (HOSPITAL_COMMUNITY)
Admission: RE | Disposition: A | Discharge status: Home / Self Care | Payer: Self-pay | Source: Ambulatory Visit | Attending: Gastroenterology

## 2014-06-04 ENCOUNTER — Encounter (HOSPITAL_COMMUNITY): Payer: Medicare Other | Admitting: *Deleted

## 2014-06-04 ENCOUNTER — Encounter (HOSPITAL_COMMUNITY): Payer: Self-pay | Admitting: *Deleted

## 2014-06-04 ENCOUNTER — Ambulatory Visit (HOSPITAL_COMMUNITY): Payer: Medicare Other | Admitting: *Deleted

## 2014-06-04 ENCOUNTER — Ambulatory Visit (HOSPITAL_COMMUNITY)
Admission: RE | Admit: 2014-06-04 | Discharge: 2014-06-04 | Disposition: A | Discharge status: Home / Self Care | Payer: Medicare Other | Source: Ambulatory Visit | Attending: Gastroenterology | Admitting: Gastroenterology

## 2014-06-04 DIAGNOSIS — K8689 Other specified diseases of pancreas: Secondary | ICD-10-CM | POA: Diagnosis present

## 2014-06-04 DIAGNOSIS — Z880 Allergy status to penicillin: Secondary | ICD-10-CM | POA: Insufficient documentation

## 2014-06-04 DIAGNOSIS — Z87820 Personal history of traumatic brain injury: Secondary | ICD-10-CM | POA: Insufficient documentation

## 2014-06-04 DIAGNOSIS — F17200 Nicotine dependence, unspecified, uncomplicated: Secondary | ICD-10-CM | POA: Diagnosis not present

## 2014-06-04 DIAGNOSIS — R932 Abnormal findings on diagnostic imaging of liver and biliary tract: Secondary | ICD-10-CM

## 2014-06-04 DIAGNOSIS — K869 Disease of pancreas, unspecified: Secondary | ICD-10-CM

## 2014-06-04 DIAGNOSIS — Z79899 Other long term (current) drug therapy: Secondary | ICD-10-CM | POA: Insufficient documentation

## 2014-06-04 DIAGNOSIS — F79 Unspecified intellectual disabilities: Secondary | ICD-10-CM | POA: Insufficient documentation

## 2014-06-04 HISTORY — PX: EUS: SHX5427

## 2014-06-04 SURGERY — UPPER ENDOSCOPIC ULTRASOUND (EUS) LINEAR
Anesthesia: Monitor Anesthesia Care

## 2014-06-04 MED ORDER — KETAMINE HCL 10 MG/ML IJ SOLN
INTRAMUSCULAR | Status: AC
  Filled 2014-06-04: qty 1

## 2014-06-04 MED ORDER — LIDOCAINE HCL (CARDIAC) 20 MG/ML IV SOLN
INTRAVENOUS | Status: AC
  Filled 2014-06-04: qty 5

## 2014-06-04 MED ORDER — PROPOFOL 10 MG/ML IV BOLUS
INTRAVENOUS | Status: AC
  Filled 2014-06-04: qty 20

## 2014-06-04 MED ORDER — LACTATED RINGERS IV SOLN
INTRAVENOUS | Status: DC
  Administered 2014-06-04: 1000 mL via INTRAVENOUS

## 2014-06-04 MED ORDER — PROPOFOL INFUSION 10 MG/ML OPTIME
INTRAVENOUS | Status: DC | PRN
  Administered 2014-06-04: 160 ug/kg/min via INTRAVENOUS

## 2014-06-04 MED ORDER — SODIUM CHLORIDE 0.9 % IV SOLN: INTRAVENOUS | Status: DC

## 2014-06-04 MED ORDER — FENTANYL CITRATE 0.05 MG/ML IJ SOLN
INTRAMUSCULAR | Status: AC
  Filled 2014-06-04: qty 2

## 2014-06-04 MED ORDER — MIDAZOLAM HCL 2 MG/2ML IJ SOLN
INTRAMUSCULAR | Status: AC
  Filled 2014-06-04: qty 2

## 2014-06-04 MED ORDER — FENTANYL CITRATE 0.05 MG/ML IJ SOLN
INTRAMUSCULAR | Status: DC | PRN
  Administered 2014-06-04: 25 ug via INTRAVENOUS
  Administered 2014-06-04: 50 ug via INTRAVENOUS
  Administered 2014-06-04: 25 ug via INTRAVENOUS

## 2014-06-04 MED ORDER — BUTAMBEN-TETRACAINE-BENZOCAINE 2-2-14 % EX AERO
INHALATION_SPRAY | CUTANEOUS | Status: DC | PRN
  Administered 2014-06-04: 1 via TOPICAL

## 2014-06-04 MED ORDER — LIDOCAINE HCL 1 % IJ SOLN
INTRAMUSCULAR | Status: DC | PRN
  Administered 2014-06-04: 50 mg via INTRADERMAL

## 2014-06-04 MED ORDER — LACTATED RINGERS IV SOLN
INTRAVENOUS | Status: DC | PRN
  Administered 2014-06-04: 07:00:00 via INTRAVENOUS

## 2014-06-04 MED ORDER — KETAMINE HCL 10 MG/ML IJ SOLN
INTRAMUSCULAR | Status: DC | PRN
  Administered 2014-06-04 (×2): 10 mg via INTRAVENOUS

## 2014-06-04 NOTE — Anesthesia Postprocedure Evaluation (Signed)
  Anesthesia Post-op Note  Patient: [name]  Procedure(s) Performed: Procedure(s) (LRB): UPPER ENDOSCOPIC ULTRASOUND (EUS) LINEAR (N/A)  Patient Location: PACU  Anesthesia Type: MAC  Level of Consciousness: awake and alert   Airway and Oxygen Therapy: Patient Spontanous Breathing  Post-op Pain: mild  Post-op Assessment: Post-op Vital signs reviewed, Patient's Cardiovascular Status Stable, Respiratory Function Stable, Patent Airway and No signs of Nausea or vomiting  Last Vitals:  Filed Vitals:   06/04/14 0850  BP: 153/73  Pulse: 60  Temp:   Resp: 12    Post-op Vital Signs: stable   Complications: No apparent anesthesia complications

## 2014-06-04 NOTE — Telephone Encounter (Signed)
Pts sister states she cannot keep the scheduled OV for pt on 06/23/14. Requests appt be moved to different date. Pt scheduled to see Dr. Henrene Pastor 07/17/14@10 :45am. Sister is aware of appt. Dr. Henrene Pastor is this appt ok with you or does he need to be worked in sooner? Please advise.

## 2014-06-04 NOTE — Interval H&P Note (Signed)
History and Physical Interval Note:  06/04/2014 7:28 AM  [name]  has presented today for surgery, with the diagnosis of Malignant neoplasm of pancreas, unspecified location of malignancy [157.9]  The various methods of treatment have been discussed with the patient and family. After consideration of risks, benefits and other options for treatment, the patient has consented to  Procedure(s): UPPER ENDOSCOPIC ULTRASOUND (EUS) LINEAR (N/A) as a surgical intervention .  The patient's history has been reviewed, patient examined, no change in status, stable for surgery.  I have reviewed the patient's chart and labs.  Questions were answered to the patient's satisfaction.     Owens Loffler P  CT scan showed pancreatic body mass, suspicious for cancer.  Planning EUS today.

## 2014-06-04 NOTE — Telephone Encounter (Signed)
Message copied by Barron Alvine on Thu Jun 04, 2014 10:51 AM ------      Message from: Aviva Signs      Created: Thu Jun 04, 2014 10:30 AM       [name],Pt is scheduled for Aug 7th arrive at 9.20 for a 9.45am apt with Dr Barry Dienes.This is her first available apt date.Marland KitchenMarland KitchenThanks Thayer Headings      ----- Message -----         From: Barron Alvine, CMA         Sent: 06/04/2014   9:08 AM           To: Aviva Signs            referral to Dr. Barry Dienes for mass in body of pancreas, favoring neuroendocrine tumor of pancreas, potentially resectable       ------

## 2014-06-04 NOTE — Telephone Encounter (Signed)
Message copied by Barron Alvine on Thu Jun 04, 2014  8:49 AM ------      Message from: Owens Loffler P      Created: Thu Jun 04, 2014  8:45 AM       Jenny Reichmann,       Just completed EUS; FNA preliminary favors neuroendocrine (body of pancreas), potentially resectable.  I will set up referral to Dr. Barry Dienes.            Egon Dittus,      He needs referral to Dr. Barry Dienes for mass in body of pancreas, favoring neuroendocrine tumor of pancreas, potentially resectable.             ------

## 2014-06-04 NOTE — Discharge Instructions (Signed)

## 2014-06-04 NOTE — Telephone Encounter (Signed)
Actually, he does not need an office visit with me at this point as he was just diagnosed today with a pancreas tumor (EUS with Dr. Ardis Hughs) and is to see a surgeon (being arranged by [facility]). Let sister know. Thanks .

## 2014-06-04 NOTE — Transfer of Care (Signed)
Immediate Anesthesia Transfer of Care Note  Patient: [name]  Procedure(s) Performed: Procedure(s): UPPER ENDOSCOPIC ULTRASOUND (EUS) LINEAR (N/A)  Patient Location: PACU and Endoscopy Unit  Anesthesia Type:MAC  Level of Consciousness: awake, alert  and oriented  Airway & Oxygen Therapy: Patient Spontanous Breathing and Patient connected to nasal cannula oxygen  Post-op Assessment: Report given to PACU RN, Post -op Vital signs reviewed and stable and Patient moving all extremities  Post vital signs: Reviewed and stable  Complications: No apparent anesthesia complications

## 2014-06-04 NOTE — Telephone Encounter (Signed)
Referral has been made.

## 2014-06-04 NOTE — Telephone Encounter (Signed)
Pts sister aware and appt cancelled.
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
## 2014-06-04 NOTE — Op Note (Signed)
[facility] [place], [id number]   ENDOSCOPIC ULTRASOUND PROCEDURE REPORT  PATIENT: [name], [name]  MR#: [id number] BIRTHDATE: 12-22-1945  GENDER: Male ENDOSCOPIST: Milus Banister, MD REFERRED BY:  Eustace Quail, M.D. PROCEDURE DATE:  06/04/2014 PROCEDURE:   Upper EUS w/FNA ASA CLASS:      Class II INDICATIONS:   pancreatic body mass seen on recent CT scan; CA 19-9 normal, ? recent weight loss. MEDICATIONS: MAC sedation, administered by CRNA  DESCRIPTION OF PROCEDURE:   After the risks benefits and alternatives of the procedure were  explained, informed consent was obtained. The patient was then placed in the left, lateral, decubitus postion and IV sedation was administered. Throughout the procedure, the patients blood pressure, pulse and oxygen saturations were monitored continuously.  Under direct visualization, the Pentax Radial EUS [id number]  endoscope was introduced through the mouth  and advanced to the second portion of the duodenum .  Water was used as necessary to provide an acoustic interface.  Upon completion of the imaging, water was removed and the patient was sent to the recovery room in satisfactory condition.  Endoscopic findings: 1. Normal UGI tract  EUS findings: 1. 3.1cm by 2.6cm hypoechoic, irregularly bordered, heterogeneous mass in body of pancreas. This is causing main pancreatic duct obstruction proximally. The mass lays close to the celiac trunk but there is a preserved plane between the mass and the vessel.  The mass does not involve the portal vein, SMA, SMV.  It likely obstructs the splenic vein, involves the splenic artery.  The mass was sampled with three transgastric passes with a 25 gauge EUS FNA needle. 2. The main pancreatic duct in body, tail was dilated (up to 4.36mm). The duct was normal in neck, head of pancreas. 3. The CBD was normal, non-dilated. 4. No peripancreatic adenopathy. 5. Limited views of  liver, spleen, portal and splenic vessels were all normal.  Impression: 3.1cm by 2.6cm mass in pancreatic body. This obstructs the main pancreatic duct, lays near but does not involvle the celiac trunk. Preliminary cytology review shows atypical cells, favoring neuroendocrine tumor. This appears potentially resectable, he will be referred to pancreatic surgeon.   _______________________________ eSignedMilus Banister, MD 06/04/2014 8:44 AM

## 2014-06-04 NOTE — Telephone Encounter (Signed)
[facility] to call pt with appt

## 2014-06-05 ENCOUNTER — Encounter (HOSPITAL_COMMUNITY): Payer: Self-pay | Admitting: Gastroenterology

## 2014-06-23 ENCOUNTER — Ambulatory Visit: Payer: Medicare Other | Admitting: Internal Medicine

## 2014-06-26 ENCOUNTER — Ambulatory Visit (INDEPENDENT_AMBULATORY_CARE_PROVIDER_SITE_OTHER): Payer: Medicare Other | Admitting: General Surgery

## 2014-06-26 ENCOUNTER — Other Ambulatory Visit (INDEPENDENT_AMBULATORY_CARE_PROVIDER_SITE_OTHER): Payer: Self-pay | Admitting: General Surgery

## 2014-06-26 VITALS — BP 122/80 | HR 62 | Temp 98.0°F | Wt 115.8 lb

## 2014-06-26 DIAGNOSIS — K869 Disease of pancreas, unspecified: Secondary | ICD-10-CM

## 2014-06-26 DIAGNOSIS — K8681 Exocrine pancreatic insufficiency: Secondary | ICD-10-CM | POA: Insufficient documentation

## 2014-06-26 DIAGNOSIS — K8689 Other specified diseases of pancreas: Secondary | ICD-10-CM
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
## 2014-06-26 NOTE — Assessment & Plan Note (Addendum)
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   Needs chest CT.   If no evidence of metastases, will plan surgery.   Splenic vein appears involved, so we will need to take this as well.   Will set up diagnostic laparoscopy, the robotic distal pancreatectomy/splenectomy.   Main risks of surgery are bleeding, infection, damage to adjacent structures, leak, heart or lung problems, hernia, need for additional procedures or surgeries, need for open operation.   Mass is central, so there is a risk of need to open.    I also discussed that we may abort surgery if we find metastatic disease.   His two sisters were present at discussion.  We reviewed surgery with anatomic diagrams.

## 2014-06-26 NOTE — Assessment & Plan Note (Signed)
Pt given samples of creon to try 1 with meals.

## 2014-06-26 NOTE — Patient Instructions (Addendum)
Take one capsule of creon before each meal.     Pancreas Surgery  THE OPERATION: The goal of the operation is to remove masses in the tail of the pancreas.  The distal pancreas and spleen will be removed.  The reason so much needs to be removed is that the blood supply and lymphatic channels and nodes are closely interconnected.  The operation takes between 2-4 hours depending on the amount of scar tissue you have present from prior surgeries, or from the mass.    WHAT HAPPENS IN THE OPERATING ROOM: I will start the operation with a diagnostic laparoscopy.  This means we will make small incisions to see if there is visible cancer outside of the pancreas.  If there is visible spread of cancer, I will stop the operation because research has shown that survival for pancreatic cancer is worse if you undergo a big operation without being able to remove all the cancer.  If no other cancer is seen, I will proceed with the rest of the operation.  You will have an incision in the midline around 3-4 inches, and several other incisions in the left upper abdomen.  We may use the robot to assist in order to minimize risk of open surgery.    Whatever the operative findings, I will discuss the case with your family after we are done in the operating room.  I will talk to you in the next few days when you are more awake.  I will see you in the hospital every weekday that I am not out of town.  My partners help see patients on the weekends and if I am out of town.    WHAT HAPPENS AFTER SURGERY: After surgery, you will go first to the recovery room,then to an appropriate level of room.  You will have many tubes and lines in place which is standard for this operation.   You will have a tube in your nose for 1-2 days in order to suction out the stomach.  YOU WILL NOT BE ABLE TO EAT FOR SEVERAL DAYS AFTER SURGERY.  You will have a catheter in your bladder.  On your abdomen, you will have a surgical drains and possibly a  pain pump with numbing medicine.  You will have compression stockings on your legs to decrease the risk of blood clots.    We will address your pain in several ways.  We will use an IV pain pump called a "PCA," or Patient Controlled Analgesia.  This allows you to press a button and immediately receive a dose of pain medication without waiting for a nurse.  We also use IV Tylenol and sometimes IV Toradol which is similar to ibuprofen.  You may also have a pump with numbing medicine delivered directly to your incision.  I use doses and medications that work for the majority of people, but you may need an adjustment to the dose or type of medicine if your pain is not adequately controlled.  Your throat may be sore, in which case you may need a throat spray or lozenges.    We will ask you to get out of bed the day after surgery in order to maximize your chances of not having complications.  Your risk of pneumonia and blood clots is lower with walking and sitting in the chair.  We will also ask you to perform breathing exercises.  We will also ask to you walk in your room and in the halls for the  above reasons, but also in order for you to keep up your strength.    EATING: We will usually start you on clear liquids in around 1-2 days if your bowel function seems to have returned.  We advance your diet slowly to make sure you are tolerating each step.  All patients do not have a normal appetite when they go home Most patients also find that their taste buds do not seem the same right after surgery, and this can continue into the time of possible post operative chemotherapy and radiation.  Some patients develop diabetes and will need assistance from a primary care doctor for medication.    OTHER TREATMENT? I will present your case when the pathology is available at our GI Multidisciplinary conference which occurs every 1-2 weeks.  Medical and Radiation Oncologists are present, as well as the pathologist and  radiologist in addition to myself.  If you have a larger tumor or if you have positive lymph nodes, we may have the oncologist stop by to see you while you are in the hospital.  Most firm post op treatment plans occur, however, once you are an outpatient because we will need to see how you are recovering from surgery.  GOING HOME! Usually you are able to go home in 3-7 days, depending on whether or not complications happen and what is going on with your overall health status.   If you have more health problems or if you have limited help at home, the therapists and nurses may recommend a temporary rehab or nursing facility to help you get back on your feet before you go home.  These decisions would be made while you are in the hospital with the assistance of a social worker or case manager.    Please bring all insurance/disability forms to our office for the staff to fill out   POSSIBLE COMPLICATIONS This is a very extensive operation and includes complications listed below: Bleeding Infection and possible wound complications such as hernia Damage to adjacent structures Leak of the pancreas (5-10%) Possible need for other procedures, such as abscess drains in radiology or endoscopy.   Possible prolonged hospital stay Possible development of diabetes or worsening of current diabetes. (5-20%) Possible diarrhea from lack of pancreatic enzymes.   Prolonged fatigue/weakness/appetite MOST PATIENTS' ENERGY LEVEL IS NOT BACK TO NORMAL FOR AT LEAST 2-3 MONTHS.  OLDER PATIENTS MAY FEEL WEAK FOR LONGER PERIODS OF TIME.   Possible early recurrence of cancer Possible complications of your medical problems such as heart disease or arrhythmias. Death (less than 1%)  All possible complications are not listed, just the most common.    FURTHER INFORMATION? Please ask questions if you find something that we did not discuss in the office and would like more information.  If you would like another appointment  if you have many questions or if your family members would like to come as well, please contact the office.    IF YOU ARE TAKING ASPIRIN, PLAVIX, COUMADIN, OR OTHER BLOOD THINNERS, LET us KNOW IMMEDIATELY SO WE CAN CONTACT YOUR PRESCRIBING HEALTH CARE PROVIDER TO HOLD THE MEDICATION FOR 5-7 DAYS BEFORE SURGERY

## 2014-06-29 ENCOUNTER — Other Ambulatory Visit (INDEPENDENT_AMBULATORY_CARE_PROVIDER_SITE_OTHER): Payer: Self-pay | Admitting: *Deleted

## 2014-06-29 ENCOUNTER — Telehealth (INDEPENDENT_AMBULATORY_CARE_PROVIDER_SITE_OTHER): Payer: Self-pay | Admitting: *Deleted

## 2014-06-29 DIAGNOSIS — K8689 Other specified diseases of pancreas: Secondary | ICD-10-CM

## 2014-06-29 DIAGNOSIS — D490 Neoplasm of unspecified behavior of digestive system: Secondary | ICD-10-CM

## 2014-06-29 NOTE — Telephone Encounter (Signed)
LMOM for pt's sister, Constance Holster, to return my call regarding CT of Chest that has been set up for pt.  Please advise that the appt has been scheduled for 07-01-14 arriving at [facility], [place] [phone] at 10:15 a.m.  Also advise that pt cannot eat any solid foods 4 hours prior to the test.  Anderson Malta

## 2014-06-30 NOTE — Telephone Encounter (Signed)
Pts sister advised of message below.

## 2014-07-01 ENCOUNTER — Ambulatory Visit
Admission: RE | Admit: 2014-07-01 | Discharge: 2014-07-01 | Disposition: A | Discharge status: Home / Self Care | Payer: Medicare Other | Source: Ambulatory Visit | Attending: General Surgery | Admitting: General Surgery

## 2014-07-01 ENCOUNTER — Telehealth (INDEPENDENT_AMBULATORY_CARE_PROVIDER_SITE_OTHER): Payer: Self-pay | Admitting: *Deleted

## 2014-07-01 DIAGNOSIS — D490 Neoplasm of unspecified behavior of digestive system: Secondary | ICD-10-CM

## 2014-07-01 MED ORDER — IOHEXOL 300 MG/ML  SOLN
75.0000 mL | Freq: Once | INTRAMUSCULAR | Status: AC | PRN
  Administered 2014-07-01: 75 mL via INTRAVENOUS

## 2014-07-01 NOTE — Telephone Encounter (Signed)
LMOM for sister, Constance Holster, to return my call.  Per Dr. Barry Dienes, let the pt know that his CT looks normal.  Thanks!   Anderson Malta

## 2014-07-01 NOTE — Telephone Encounter (Signed)
Message copied by Jeanann Lewandowsky on Wed Jul 01, 2014  3:33 PM ------      Message from: Stark Klein      Created: Wed Jul 01, 2014 12:49 PM       Please let patient know that CT looks normal. ------

## 2014-07-17 ENCOUNTER — Ambulatory Visit: Payer: Medicare Other | Admitting: Internal Medicine

## 2014-07-21 ENCOUNTER — Other Ambulatory Visit (INDEPENDENT_AMBULATORY_CARE_PROVIDER_SITE_OTHER): Payer: Self-pay | Admitting: General Surgery

## 2014-08-28 ENCOUNTER — Encounter (HOSPITAL_COMMUNITY): Payer: Self-pay | Admitting: Pharmacy Technician

## 2014-08-28 NOTE — Patient Instructions (Addendum)
BANYAN GOODCHILD  08/28/2014                           YOUR PROCEDURE IS SCHEDULED ON:  2014/09/06                ENTER FROM [address] - GO TO [address]               LOOK FOR [facility]  / [employer]                              FOLLOW  SIGNS TO SHORT STAY CENTER                 ARRIVE AT SHORT STAY AT:  7:15 AM               CALL THIS NUMBER IF ANY PROBLEMS THE DAY OF SURGERY :               [phone]                                REMEMBER:   Do not eat food or drink liquids AFTER MIDNIGHT              STOP ASPIRIN AND HERBAL MEDS 5 DAYS PREOP              USE FLEET ENEMA THE NIGHT BEFORE SURGERY                  Take these medicines the morning of surgery with               A SIPS OF WATER :     NONE    Do not wear jewelry, make-up   Do not wear lotions, powders, or perfumes.   Do not shave legs or underarms 12 hrs. before surgery (men may shave face)  Do not bring valuables to the hospital.  Contacts, dentures or bridgework may not be worn into surgery.  Leave suitcase in the car. After surgery it may be brought to your room.  For patients admitted to the hospital more than one night, checkout time is            11:00 AM                                                       ________________________________________________________________________                                                                                                  [facility]  Before surgery, you can play an important role.  Because skin is not sterile, your skin needs to be as free of germs as possible.  You can reduce the  number of germs on your skin by washing with CHG (chlorahexidine gluconate) soap before surgery.  CHG is an antiseptic cleaner which kills germs and bonds with the skin to continue killing germs even after washing. Please DO NOT use if you have an allergy to CHG or antibacterial soaps.  If your skin becomes reddened/irritated  stop using the CHG and inform your nurse when you arrive at Short Stay. Do not shave (including legs and underarms) for at least 48 hours prior to the first CHG shower.  You may shave your face. Please follow these instructions carefully:   1.  Shower with CHG Soap the night before surgery and the  morning of Surgery.   2.  If you choose to wash your hair, wash your hair first as usual with your  normal  Shampoo.   3.  After you shampoo, rinse your hair and body thoroughly to remove the  shampoo.                                         4.  Use CHG as you would any other liquid soap.  You can apply chg directly  to the skin and wash . Gently wash with scrungie or clean wascloth    5.  Apply the CHG Soap to your body ONLY FROM THE NECK DOWN.   Do not use on open                           Wound or open sores. Avoid contact with eyes, ears mouth and genitals (private parts).                        Genitals (private parts) with your normal soap.              6.  Wash thoroughly, paying special attention to the area where your surgery  will be performed.   7.  Thoroughly rinse your body with warm water from the neck down.   8.  DO NOT shower/wash with your normal soap after using and rinsing off  the CHG Soap .                9.  Pat yourself dry with a clean towel.             10.  Wear clean pajamas.             11.  Place clean sheets on your bed the night of your first shower and do not  sleep with pets.  Day of Surgery : Do not apply any lotions/deodorants the morning of surgery.  Please wear clean clothes to the hospital/surgery center.  FAILURE TO FOLLOW THESE INSTRUCTIONS MAY RESULT IN THE CANCELLATION OF YOUR SURGERY    PATIENT SIGNATURE_________________________________  ______________________________________________________________________

## 2014-08-31 ENCOUNTER — Encounter (INDEPENDENT_AMBULATORY_CARE_PROVIDER_SITE_OTHER): Payer: Self-pay

## 2014-08-31 ENCOUNTER — Encounter (HOSPITAL_COMMUNITY)
Admission: RE | Admit: 2014-08-31 | Discharge: 2014-08-31 | Disposition: A | Discharge status: Home / Self Care | Payer: Medicare Other | Source: Ambulatory Visit | Attending: General Surgery | Admitting: General Surgery

## 2014-08-31 ENCOUNTER — Encounter (HOSPITAL_COMMUNITY): Payer: Self-pay

## 2014-08-31 HISTORY — DX: Other specified diseases of pancreas: K86.89

## 2014-08-31 HISTORY — DX: Weakness: R53.1

## 2014-08-31 HISTORY — DX: Unspecified osteoarthritis, unspecified site: M19.90

## 2014-08-31 LAB — CBC WITH DIFFERENTIAL/PLATELET
BASOS PCT: 0 % (ref 0–1)
Basophils Absolute: 0 10*3/uL (ref 0.0–0.1)
Eosinophils Absolute: 0.2 10*3/uL (ref 0.0–0.7)
Eosinophils Relative: 2 % (ref 0–5)
HCT: 38.1 % — ABNORMAL LOW (ref 39.0–52.0)
Hemoglobin: 12.5 g/dL — ABNORMAL LOW (ref 13.0–17.0)
Lymphocytes Relative: 23 % (ref 12–46)
Lymphs Abs: 2 10*3/uL (ref 0.7–4.0)
MCH: 30.6 pg (ref 26.0–34.0)
MCHC: 32.8 g/dL (ref 30.0–36.0)
MCV: 93.2 fL (ref 78.0–100.0)
Monocytes Absolute: 0.5 10*3/uL (ref 0.1–1.0)
Monocytes Relative: 6 % (ref 3–12)
NEUTROS PCT: 69 % (ref 43–77)
Neutro Abs: 6.3 10*3/uL (ref 1.7–7.7)
PLATELETS: 350 10*3/uL (ref 150–400)
RBC: 4.09 MIL/uL — ABNORMAL LOW (ref 4.22–5.81)
RDW: 14.7 % (ref 11.5–15.5)
WBC: 9 10*3/uL (ref 4.0–10.5)

## 2014-08-31 LAB — COMPREHENSIVE METABOLIC PANEL
ALK PHOS: 78 U/L (ref 39–117)
ALT: 9 U/L (ref 0–53)
ANION GAP: 10 (ref 5–15)
AST: 13 U/L (ref 0–37)
Albumin: 4 g/dL (ref 3.5–5.2)
BUN: 12 mg/dL (ref 6–23)
CO2: 28 mEq/L (ref 19–32)
Calcium: 9.8 mg/dL (ref 8.4–10.5)
Chloride: 107 mEq/L (ref 96–112)
Creatinine, Ser: 0.85 mg/dL (ref 0.50–1.35)
GFR calc Af Amer: >90 mL/min (ref >90)
GFR calc non Af Amer: 88 mL/min — ABNORMAL LOW (ref >90)
Glucose, Bld: 95 mg/dL (ref 70–99)
POTASSIUM: 5.1 meq/L (ref 3.7–5.3)
SODIUM: 145 meq/L (ref 137–147)
TOTAL PROTEIN: 7.2 g/dL (ref 6.0–8.3)
Total Bilirubin: 0.3 mg/dL (ref 0.3–1.2)

## 2014-08-31 LAB — URINALYSIS, ROUTINE W REFLEX MICROSCOPIC
BILIRUBIN URINE: NEGATIVE
Glucose, UA: 100 mg/dL — AB
Hgb urine dipstick: NEGATIVE
Ketones, ur: NEGATIVE mg/dL
NITRITE: NEGATIVE
Protein, ur: NEGATIVE mg/dL
SPECIFIC GRAVITY, URINE: 1.022 (ref 1.005–1.030)
UROBILINOGEN UA: 0.2 mg/dL (ref 0.0–1.0)
pH: 6 (ref 5.0–8.0)

## 2014-08-31 LAB — PROTIME-INR
INR: 1.06 (ref 0.00–1.49)
PROTHROMBIN TIME: 13.9 s (ref 11.6–15.2)

## 2014-08-31 LAB — URINE MICROSCOPIC-ADD ON

## 2014-08-31 LAB — HEMOGLOBIN A1C
Hgb A1c MFr Bld: 5.7 % — ABNORMAL HIGH (ref <5.7)
Mean Plasma Glucose: 117 mg/dL — ABNORMAL HIGH (ref <117)

## 2014-09-03 ENCOUNTER — Encounter (HOSPITAL_COMMUNITY): Payer: Medicare Other | Admitting: Anesthesiology

## 2014-09-03 ENCOUNTER — Encounter (HOSPITAL_COMMUNITY): Payer: Self-pay | Admitting: *Deleted

## 2014-09-03 ENCOUNTER — Inpatient Hospital Stay (HOSPITAL_COMMUNITY): Payer: Medicare Other

## 2014-09-03 ENCOUNTER — Inpatient Hospital Stay (HOSPITAL_COMMUNITY)
Admission: RE | Admit: 2014-09-03 | Discharge: 2014-09-20 | DRG: 405 | Disposition: E | Payer: Medicare Other | Source: Ambulatory Visit | Attending: General Surgery | Admitting: General Surgery

## 2014-09-03 ENCOUNTER — Encounter (HOSPITAL_COMMUNITY): Admission: RE | Disposition: E | Payer: Self-pay | Source: Ambulatory Visit | Attending: General Surgery

## 2014-09-03 ENCOUNTER — Inpatient Hospital Stay (HOSPITAL_COMMUNITY): Payer: Medicare Other | Admitting: Anesthesiology

## 2014-09-03 DIAGNOSIS — J95821 Acute postprocedural respiratory failure: Secondary | ICD-10-CM | POA: Diagnosis not present

## 2014-09-03 DIAGNOSIS — Z800 Family history of malignant neoplasm of digestive organs: Secondary | ICD-10-CM | POA: Diagnosis not present

## 2014-09-03 DIAGNOSIS — Z8249 Family history of ischemic heart disease and other diseases of the circulatory system: Secondary | ICD-10-CM

## 2014-09-03 DIAGNOSIS — Z9911 Dependence on respirator [ventilator] status: Secondary | ICD-10-CM | POA: Diagnosis not present

## 2014-09-03 DIAGNOSIS — Z87820 Personal history of traumatic brain injury: Secondary | ICD-10-CM | POA: Diagnosis not present

## 2014-09-03 DIAGNOSIS — Z808 Family history of malignant neoplasm of other organs or systems: Secondary | ICD-10-CM

## 2014-09-03 DIAGNOSIS — Z833 Family history of diabetes mellitus: Secondary | ICD-10-CM

## 2014-09-03 DIAGNOSIS — F419 Anxiety disorder, unspecified: Secondary | ICD-10-CM | POA: Diagnosis present

## 2014-09-03 DIAGNOSIS — Z79899 Other long term (current) drug therapy: Secondary | ICD-10-CM

## 2014-09-03 DIAGNOSIS — Z880 Allergy status to penicillin: Secondary | ICD-10-CM | POA: Diagnosis not present

## 2014-09-03 DIAGNOSIS — K869 Disease of pancreas, unspecified: Secondary | ICD-10-CM

## 2014-09-03 DIAGNOSIS — R578 Other shock: Secondary | ICD-10-CM | POA: Diagnosis not present

## 2014-09-03 DIAGNOSIS — J9811 Atelectasis: Secondary | ICD-10-CM

## 2014-09-03 DIAGNOSIS — Z86010 Personal history of colonic polyps: Secondary | ICD-10-CM

## 2014-09-03 DIAGNOSIS — F79 Unspecified intellectual disabilities: Secondary | ICD-10-CM | POA: Diagnosis present

## 2014-09-03 DIAGNOSIS — D62 Acute posthemorrhagic anemia: Secondary | ICD-10-CM | POA: Diagnosis not present

## 2014-09-03 DIAGNOSIS — C259 Malignant neoplasm of pancreas, unspecified: Secondary | ICD-10-CM | POA: Diagnosis present

## 2014-09-03 DIAGNOSIS — F17210 Nicotine dependence, cigarettes, uncomplicated: Secondary | ICD-10-CM | POA: Diagnosis present

## 2014-09-03 DIAGNOSIS — M1990 Unspecified osteoarthritis, unspecified site: Secondary | ICD-10-CM | POA: Diagnosis present

## 2014-09-03 DIAGNOSIS — D689 Coagulation defect, unspecified: Secondary | ICD-10-CM | POA: Diagnosis present

## 2014-09-03 DIAGNOSIS — Z66 Do not resuscitate: Secondary | ICD-10-CM | POA: Diagnosis present

## 2014-09-03 DIAGNOSIS — E872 Acidosis, unspecified: Secondary | ICD-10-CM | POA: Diagnosis not present

## 2014-09-03 DIAGNOSIS — R571 Hypovolemic shock: Secondary | ICD-10-CM | POA: Diagnosis not present

## 2014-09-03 DIAGNOSIS — D65 Disseminated intravascular coagulation [defibrination syndrome]: Secondary | ICD-10-CM | POA: Diagnosis present

## 2014-09-03 DIAGNOSIS — D509 Iron deficiency anemia, unspecified: Secondary | ICD-10-CM | POA: Diagnosis present

## 2014-09-03 DIAGNOSIS — K8689 Other specified diseases of pancreas: Secondary | ICD-10-CM

## 2014-09-03 DIAGNOSIS — R579 Shock, unspecified: Secondary | ICD-10-CM

## 2014-09-03 LAB — BLOOD GAS, ARTERIAL
ACID-BASE DEFICIT: 22.8 mmol/L — AB (ref 0.0–2.0)
ACID-BASE DEFICIT: 25.7 mmol/L — AB (ref 0.0–2.0)
BICARBONATE: 4.9 meq/L — AB (ref 20.0–24.0)
Bicarbonate: 6.7 mEq/L — ABNORMAL LOW (ref 20.0–24.0)
Drawn by: 257701
Drawn by: 308601
FIO2: 1 %
FIO2: 0.5 %
MECHVT: 530 mL
O2 Saturation: 99.4 %
O2 Saturation: 99 %
PATIENT TEMPERATURE: 98.6
PCO2 ART: 25.5 mmHg — AB (ref 35.0–45.0)
PEEP: 5 cmH2O
PEEP: 5 cmH2O
PH ART: 6.918 — AB (ref 7.350–7.450)
PO2 ART: 266 mmHg — AB (ref 80.0–100.0)
Patient temperature: 98.6
RATE: 16 resp/min
RATE: 22 resp/min
TCO2: 7.1 mmol/L (ref 0–100)
TCO2: 5.4 mmol/L (ref 0–100)
VT: 530 mL
pCO2 arterial: 28.8 mmHg — ABNORMAL LOW (ref 35.0–45.0)
pH, Arterial: 6.999 — CL (ref 7.350–7.450)
pO2, Arterial: 539 mmHg — ABNORMAL HIGH (ref 80.0–100.0)

## 2014-09-03 LAB — CBC
HEMATOCRIT: 26.5 % — AB (ref 39.0–52.0)
HEMATOCRIT: 23.6 % — AB (ref 39.0–52.0)
Hemoglobin: 8.8 g/dL — ABNORMAL LOW (ref 13.0–17.0)
Hemoglobin: 7.8 g/dL — ABNORMAL LOW (ref 13.0–17.0)
MCH: 31 pg (ref 26.0–34.0)
MCH: 29.7 pg (ref 26.0–34.0)
MCHC: 33.2 g/dL (ref 30.0–36.0)
MCHC: 33.1 g/dL (ref 30.0–36.0)
MCV: 93.3 fL (ref 78.0–100.0)
MCV: 89.7 fL (ref 78.0–100.0)
PLATELETS: 114 10*3/uL — AB (ref 150–400)
Platelets: 50 10*3/uL — ABNORMAL LOW (ref 150–400)
RBC: 2.84 MIL/uL — ABNORMAL LOW (ref 4.22–5.81)
RBC: 2.63 MIL/uL — ABNORMAL LOW (ref 4.22–5.81)
RDW: 14.2 % (ref 11.5–15.5)
RDW: 15.8 % — AB (ref 11.5–15.5)
WBC: 10.8 10*3/uL — AB (ref 4.0–10.5)
WBC: 10.8 10*3/uL — ABNORMAL HIGH (ref 4.0–10.5)

## 2014-09-03 LAB — BASIC METABOLIC PANEL
ANION GAP: 24 — AB (ref 5–15)
BUN: 10 mg/dL (ref 6–23)
CO2: 9 meq/L — AB (ref 19–32)
Calcium: 7 mg/dL — ABNORMAL LOW (ref 8.4–10.5)
Chloride: 112 mEq/L (ref 96–112)
Creatinine, Ser: 1.18 mg/dL (ref 0.50–1.35)
GFR calc Af Amer: 72 mL/min — ABNORMAL LOW (ref >90)
GFR calc non Af Amer: 62 mL/min — ABNORMAL LOW (ref >90)
Glucose, Bld: 160 mg/dL — ABNORMAL HIGH (ref 70–99)
Potassium: 5 mEq/L (ref 3.7–5.3)
SODIUM: 145 meq/L (ref 137–147)

## 2014-09-03 LAB — GLUCOSE, CAPILLARY: Glucose-Capillary: 306 mg/dL — ABNORMAL HIGH (ref 70–99)

## 2014-09-03 LAB — APTT: APTT: 44 s — AB (ref 24–37)

## 2014-09-03 LAB — TROPONIN I

## 2014-09-03 LAB — CANCER ANTIGEN 19-9: CA 19-9: 29.3 U/mL — ABNORMAL LOW (ref <35.0)

## 2014-09-03 LAB — PROTIME-INR
INR: 1.69 — AB (ref 0.00–1.49)
Prothrombin Time: 20 seconds — ABNORMAL HIGH (ref 11.6–15.2)

## 2014-09-03 LAB — PREPARE RBC (CROSSMATCH)

## 2014-09-03 SURGERY — ROBOTIC ASSISTED LAPAROSCOPIC CHOLECYSTECTOMY
Anesthesia: General

## 2014-09-03 MED ORDER — LACTATED RINGERS IV SOLN
INTRAVENOUS | Status: DC | PRN
  Administered 2014-09-03 (×4): via INTRAVENOUS

## 2014-09-03 MED ORDER — DEXAMETHASONE SODIUM PHOSPHATE 10 MG/ML IJ SOLN
INTRAMUSCULAR | Status: DC | PRN
  Administered 2014-09-03: 10 mg via INTRAVENOUS

## 2014-09-03 MED ORDER — CALCIUM GLUCONATE 10 % IV SOLN
1.0000 g | Freq: Once | INTRAVENOUS | Status: AC
  Administered 2014-09-03: .25 mg via INTRAVENOUS
  Filled 2014-09-03: qty 10

## 2014-09-03 MED ORDER — DEXTROSE-NACL 5-0.9 % IV SOLN: INTRAVENOUS | Status: DC

## 2014-09-03 MED ORDER — PHENYLEPHRINE HCL 10 MG/ML IJ SOLN
INTRAMUSCULAR | Status: AC
  Filled 2014-09-03: qty 1

## 2014-09-03 MED ORDER — VASOPRESSIN 20 UNIT/ML IJ SOLN
0.0300 [IU]/min | INTRAVENOUS | Status: DC
  Administered 2014-09-03: 0.03 [IU]/min via INTRAVENOUS
  Filled 2014-09-03: qty 2

## 2014-09-03 MED ORDER — SODIUM CHLORIDE 0.9 % IV SOLN: Freq: Once | INTRAVENOUS | Status: DC

## 2014-09-03 MED ORDER — COAGULATION FACTOR VIIA RECOMB 1 MG IV SOLR
90.0000 ug/kg | Freq: Once | INTRAVENOUS | Status: AC
  Administered 2014-09-03: 5000 ug via INTRAVENOUS
  Filled 2014-09-03: qty 5

## 2014-09-03 MED ORDER — TISSEEL VH 10 ML EX KIT
PACK | CUTANEOUS | Status: AC
  Filled 2014-09-03: qty 2

## 2014-09-03 MED ORDER — ONDANSETRON HCL 4 MG/2ML IJ SOLN: 4.0000 mg | Freq: Four times a day (QID) | INTRAMUSCULAR | Status: DC | PRN

## 2014-09-03 MED ORDER — DEXAMETHASONE SODIUM PHOSPHATE 10 MG/ML IJ SOLN
INTRAMUSCULAR | Status: AC
  Filled 2014-09-03: qty 1

## 2014-09-03 MED ORDER — EPHEDRINE SULFATE 50 MG/ML IJ SOLN
INTRAMUSCULAR | Status: DC | PRN
  Administered 2014-09-03: 10 mg via INTRAVENOUS
  Administered 2014-09-03: 5 mg via INTRAVENOUS
  Administered 2014-09-03: 10 mg via INTRAVENOUS

## 2014-09-03 MED ORDER — FENTANYL BOLUS VIA INFUSION
25.0000 ug | INTRAVENOUS | Status: DC | PRN
  Filled 2014-09-03: qty 50

## 2014-09-03 MED ORDER — HEPARIN 6000 UNITS/NORMAL SALINE 500 ML OPTIME IRRIGATION: 1.0000 "application " | Freq: Once | Status: DC

## 2014-09-03 MED ORDER — PROMETHAZINE HCL 25 MG/ML IJ SOLN: 6.2500 mg | INTRAMUSCULAR | Status: DC | PRN

## 2014-09-03 MED ORDER — SUCCINYLCHOLINE CHLORIDE 20 MG/ML IJ SOLN
INTRAMUSCULAR | Status: DC | PRN
  Administered 2014-09-03: 100 mg via INTRAVENOUS

## 2014-09-03 MED ORDER — SODIUM CHLORIDE 0.9 % IV SOLN
25.0000 ug/h | INTRAVENOUS | Status: DC
  Filled 2014-09-03: qty 50

## 2014-09-03 MED ORDER — VASOPRESSIN 20 UNIT/ML IJ SOLN
INTRAMUSCULAR | Status: AC
  Filled 2014-09-03: qty 1

## 2014-09-03 MED ORDER — ETOMIDATE 2 MG/ML IV SOLN
INTRAVENOUS | Status: AC
  Filled 2014-09-03: qty 10

## 2014-09-03 MED ORDER — ALBUMIN HUMAN 25 % IV SOLN
12.5000 g | Freq: Once | INTRAVENOUS | Status: DC
  Filled 2014-09-03: qty 50

## 2014-09-03 MED ORDER — LACTATED RINGERS IR SOLN
Status: DC | PRN
  Administered 2014-09-03: 1000 mL

## 2014-09-03 MED ORDER — FENTANYL CITRATE 0.05 MG/ML IJ SOLN
INTRAMUSCULAR | Status: AC
  Filled 2014-09-03: qty 2

## 2014-09-03 MED ORDER — PHENYLEPHRINE HCL 10 MG/ML IJ SOLN
20.0000 mg | INTRAVENOUS | Status: DC | PRN
  Administered 2014-09-03: 500 ug/min via INTRAVENOUS
  Administered 2014-09-03: 15:00:00 via INTRAVENOUS

## 2014-09-03 MED ORDER — SODIUM BICARBONATE 8.4 % IV SOLN
INTRAVENOUS | Status: DC
  Administered 2014-09-03: 17:00:00 via INTRAVENOUS
  Filled 2014-09-03 (×2): qty 150

## 2014-09-03 MED ORDER — PHENYLEPHRINE HCL 10 MG/ML IJ SOLN
30.0000 ug/min | INTRAVENOUS | Status: DC
  Administered 2014-09-03 (×2): 200 ug/min via INTRAVENOUS
  Filled 2014-09-03 (×2): qty 1

## 2014-09-03 MED ORDER — PROPOFOL 10 MG/ML IV BOLUS
INTRAVENOUS | Status: AC
  Filled 2014-09-03: qty 20

## 2014-09-03 MED ORDER — HYDROMORPHONE HCL 1 MG/ML IJ SOLN: 0.2500 mg | INTRAMUSCULAR | Status: DC | PRN

## 2014-09-03 MED ORDER — CIPROFLOXACIN IN D5W 400 MG/200ML IV SOLN
400.0000 mg | INTRAVENOUS | Status: AC
Start: 2014-09-03 — End: 2014-09-03
  Administered 2014-09-03: 400 mg via INTRAVENOUS

## 2014-09-03 MED ORDER — KCL IN DEXTROSE-NACL 10-5-0.45 MEQ/L-%-% IV SOLN
INTRAVENOUS | Status: DC
  Filled 2014-09-03: qty 1000

## 2014-09-03 MED ORDER — PANTOPRAZOLE SODIUM 40 MG IV SOLR: 40.0000 mg | INTRAVENOUS | Status: DC

## 2014-09-03 MED ORDER — NOREPINEPHRINE BITARTRATE 1 MG/ML IV SOLN
2.0000 ug/min | INTRAVENOUS | Status: DC
  Administered 2014-09-03 (×2): 50 ug/min via INTRAVENOUS
  Filled 2014-09-03: qty 16

## 2014-09-03 MED ORDER — 0.9 % SODIUM CHLORIDE (POUR BTL) OPTIME
TOPICAL | Status: DC | PRN
  Administered 2014-09-03: 4000 mL

## 2014-09-03 MED ORDER — DEXTROSE 5 % IV SOLN
30.0000 ug/min | INTRAVENOUS | Status: DC
  Administered 2014-09-03 (×2): 200 ug/min via INTRAVENOUS
  Filled 2014-09-03 (×2): qty 2

## 2014-09-03 MED ORDER — LIDOCAINE HCL (PF) 1 % IJ SOLN
INTRAMUSCULAR | Status: DC | PRN
  Administered 2014-09-03: 10 mL

## 2014-09-03 MED ORDER — PHENYLEPHRINE HCL 10 MG/ML IJ SOLN
INTRAMUSCULAR | Status: AC
  Filled 2014-09-03: qty 2

## 2014-09-03 MED ORDER — LIDOCAINE HCL (CARDIAC) 20 MG/ML IV SOLN
INTRAVENOUS | Status: AC
  Filled 2014-09-03: qty 5

## 2014-09-03 MED ORDER — ALBUMIN HUMAN 5 % IV SOLN
12.5000 g | Freq: Once | INTRAVENOUS | Status: AC
  Administered 2014-09-03: 12.5 g via INTRAVENOUS
  Filled 2014-09-03: qty 250

## 2014-09-03 MED ORDER — SODIUM CHLORIDE 0.9 % IR SOLN
Status: DC
  Filled 2014-09-03: qty 1.2

## 2014-09-03 MED ORDER — FENTANYL CITRATE 0.05 MG/ML IJ SOLN
INTRAMUSCULAR | Status: DC | PRN
  Administered 2014-09-03: 50 ug via INTRAVENOUS
  Administered 2014-09-03: 100 ug via INTRAVENOUS
  Administered 2014-09-03 (×4): 50 ug via INTRAVENOUS

## 2014-09-03 MED ORDER — LACTATED RINGERS IV SOLN
INTRAVENOUS | Status: DC | PRN
  Administered 2014-09-03 (×5): via INTRAVENOUS

## 2014-09-03 MED ORDER — PHENYLEPHRINE HCL 10 MG/ML IJ SOLN
INTRAMUSCULAR | Status: DC | PRN
  Administered 2014-09-03 (×5): 80 ug via INTRAVENOUS

## 2014-09-03 MED ORDER — CALCIUM GLUCONATE 10 % IV SOLN
1.0000 g | Freq: Once | INTRAVENOUS | Status: AC
  Administered 2014-09-03: 1 g via INTRAVENOUS
  Filled 2014-09-03: qty 10

## 2014-09-03 MED ORDER — PHENYLEPHRINE HCL 10 MG/ML IJ SOLN
INTRAMUSCULAR | Status: AC
  Filled 2014-09-03: qty 4

## 2014-09-03 MED ORDER — CALCIUM CHLORIDE 10 % IV SOLN
1.0000 g | Freq: Once | INTRAVENOUS | Status: AC
  Administered 2014-09-03: 1 g via INTRAVENOUS
  Filled 2014-09-03: qty 10

## 2014-09-03 MED ORDER — SODIUM BICARBONATE 8.4 % IV SOLN
INTRAVENOUS | Status: AC
  Filled 2014-09-03: qty 50

## 2014-09-03 MED ORDER — PROPOFOL 10 MG/ML IV BOLUS
INTRAVENOUS | Status: DC | PRN
  Administered 2014-09-03: 200 mg via INTRAVENOUS

## 2014-09-03 MED ORDER — CETYLPYRIDINIUM CHLORIDE 0.05 % MT LIQD: 7.0000 mL | Freq: Four times a day (QID) | OROMUCOSAL | Status: DC

## 2014-09-03 MED ORDER — LACTATED RINGERS IV SOLN
INTRAVENOUS | Status: DC | PRN
  Administered 2014-09-03: 15:00:00 via INTRAVENOUS

## 2014-09-03 MED ORDER — VITAMIN K1 10 MG/ML IJ SOLN
10.0000 mg | Freq: Once | INTRAVENOUS | Status: AC
  Administered 2014-09-03: 10 mg via INTRAVENOUS
  Filled 2014-09-03: qty 1

## 2014-09-03 MED ORDER — PHENYLEPHRINE HCL 10 MG/ML IJ SOLN
INTRAMUSCULAR | Status: AC
  Filled 2014-09-03: qty 3

## 2014-09-03 MED ORDER — ROCURONIUM BROMIDE 100 MG/10ML IV SOLN
INTRAVENOUS | Status: AC
  Filled 2014-09-03: qty 1

## 2014-09-03 MED ORDER — ONDANSETRON HCL 4 MG/2ML IJ SOLN
INTRAMUSCULAR | Status: AC
  Filled 2014-09-03: qty 2

## 2014-09-03 MED ORDER — MORPHINE SULFATE 2 MG/ML IJ SOLN: 1.0000 mg | INTRAMUSCULAR | Status: DC | PRN

## 2014-09-03 MED ORDER — LIDOCAINE HCL 1 % IJ SOLN
INTRAMUSCULAR | Status: AC
  Filled 2014-09-03: qty 20

## 2014-09-03 MED ORDER — SODIUM CHLORIDE 0.9 % IV SOLN
1.0000 g | Freq: Once | INTRAVENOUS | Status: DC
  Filled 2014-09-03: qty 10

## 2014-09-03 MED ORDER — CHLORHEXIDINE GLUCONATE 0.12 % MT SOLN
15.0000 mL | Freq: Two times a day (BID) | OROMUCOSAL | Status: DC
  Administered 2014-09-03: 15 mL via OROMUCOSAL
  Filled 2014-09-03: qty 15

## 2014-09-03 MED ORDER — EPINEPHRINE HCL 1 MG/ML IJ SOLN
0.5000 ug/min | INTRAVENOUS | Status: DC
  Filled 2014-09-03: qty 1

## 2014-09-03 MED ORDER — ONDANSETRON HCL 4 MG/2ML IJ SOLN
INTRAMUSCULAR | Status: DC | PRN
  Administered 2014-09-03: 4 mg via INTRAVENOUS

## 2014-09-03 MED ORDER — CIPROFLOXACIN IN D5W 400 MG/200ML IV SOLN
400.0000 mg | Freq: Two times a day (BID) | INTRAVENOUS | Status: DC
  Filled 2014-09-03: qty 200

## 2014-09-03 MED ORDER — PHENYLEPHRINE 40 MCG/ML (10ML) SYRINGE FOR IV PUSH (FOR BLOOD PRESSURE SUPPORT)
PREFILLED_SYRINGE | INTRAVENOUS | Status: AC
  Filled 2014-09-03: qty 10

## 2014-09-03 MED ORDER — 0.9 % SODIUM CHLORIDE (POUR BTL) OPTIME
TOPICAL | Status: DC | PRN
  Administered 2014-09-03: 5000 mL

## 2014-09-03 MED ORDER — INSULIN ASPART 100 UNIT/ML ~~LOC~~ SOLN
0.0000 [IU] | SUBCUTANEOUS | Status: DC
  Administered 2014-09-03: 11 [IU] via SUBCUTANEOUS

## 2014-09-03 MED ORDER — MIDAZOLAM HCL 2 MG/2ML IJ SOLN: 1.0000 mg | INTRAMUSCULAR | Status: DC | PRN

## 2014-09-03 MED ORDER — CIPROFLOXACIN IN D5W 400 MG/200ML IV SOLN
INTRAVENOUS | Status: AC
  Filled 2014-09-03: qty 200

## 2014-09-03 MED ORDER — BUPIVACAINE-EPINEPHRINE (PF) 0.25% -1:200000 IJ SOLN
INTRAMUSCULAR | Status: AC
  Filled 2014-09-03: qty 30

## 2014-09-03 MED ORDER — SODIUM BICARBONATE 8.4 % IV SOLN
150.0000 meq | Freq: Once | INTRAVENOUS | Status: AC
  Administered 2014-09-03: 150 meq via INTRAVENOUS
  Filled 2014-09-03: qty 150

## 2014-09-03 MED ORDER — DEXTROSE 5 % IV SOLN
2.0000 ug/min | INTRAVENOUS | Status: DC
  Administered 2014-09-03: 10 ug/min via INTRAVENOUS
  Administered 2014-09-03: 40 ug/min via INTRAVENOUS
  Filled 2014-09-03 (×2): qty 4

## 2014-09-03 MED ORDER — DEXTROSE IN LACTATED RINGERS 5 % IV SOLN: Freq: Once | INTRAVENOUS | Status: DC

## 2014-09-03 MED ORDER — SODIUM CHLORIDE 0.9 % IV SOLN
10.0000 mg | INTRAVENOUS | Status: DC | PRN
  Administered 2014-09-03 (×2): via INTRAVENOUS
  Administered 2014-09-03: 15 ug/min via INTRAVENOUS

## 2014-09-03 MED ORDER — SODIUM CHLORIDE 0.9 % IV SOLN
0.0000 ug/h | INTRAVENOUS | Status: DC
  Administered 2014-09-03: 25 ug/h via INTRAVENOUS

## 2014-09-03 MED ORDER — FENTANYL CITRATE 0.05 MG/ML IJ SOLN
INTRAMUSCULAR | Status: AC
  Filled 2014-09-03: qty 5

## 2014-09-03 MED ORDER — ROCURONIUM BROMIDE 100 MG/10ML IV SOLN
INTRAVENOUS | Status: DC | PRN
  Administered 2014-09-03: 10 mg via INTRAVENOUS
  Administered 2014-09-03: 40 mg via INTRAVENOUS
  Administered 2014-09-03 (×2): 10 mg via INTRAVENOUS

## 2014-09-03 MED ORDER — BUPIVACAINE-EPINEPHRINE 0.25% -1:200000 IJ SOLN
INTRAMUSCULAR | Status: DC | PRN
  Administered 2014-09-03: 10 mL

## 2014-09-03 MED ORDER — SODIUM CHLORIDE 0.9 % IV BOLUS (SEPSIS)
1000.0000 mL | Freq: Once | INTRAVENOUS | Status: AC
  Administered 2014-09-03: 1000 mL via INTRAVENOUS

## 2014-09-03 MED ORDER — SODIUM CHLORIDE 0.9 % IV SOLN
INTRAVENOUS | Status: DC | PRN
  Administered 2014-09-03 (×3): via INTRAVENOUS

## 2014-09-03 MED ORDER — ONDANSETRON HCL 4 MG PO TABS: 4.0000 mg | ORAL_TABLET | Freq: Four times a day (QID) | ORAL | Status: DC | PRN

## 2014-09-03 MED ORDER — LIDOCAINE HCL (CARDIAC) 20 MG/ML IV SOLN
INTRAVENOUS | Status: DC | PRN
  Administered 2014-09-03: 100 mg via INTRAVENOUS

## 2014-09-03 SURGICAL SUPPLY — 116 items
APPLICATOR DUAL LIQUID (MISCELLANEOUS) IMPLANT
APPLIER CLIP 5 13 M/L LIGAMAX5 (MISCELLANEOUS)
APR CLP MED LRG 5 ANG JAW (MISCELLANEOUS)
BAG SPEC RTRVL LRG 6X4 10 (ENDOMECHANICALS)
BLADE SURG SZ11 CARB STEEL (BLADE) ×3 IMPLANT
BOOT SUTURE VASCULAR YLW (MISCELLANEOUS) ×3
CABLE HIGH FREQUENCY MONO STRZ (ELECTRODE) IMPLANT
CANNULA REDUC XI 12-8 STAPL (CANNULA)
CANNULA REDUC XI 12-8MM STAPL (CANNULA)
CANNULA REDUCER 12-8 DVNC XI (CANNULA) IMPLANT
CANNULA VESSEL W/WING WO/VALVE (CANNULA) ×3 IMPLANT
CELLS DAT CNTRL 66122 CELL SVR (MISCELLANEOUS) IMPLANT
CHLORAPREP W/TINT 26ML (MISCELLANEOUS) ×3 IMPLANT
CLAMP SUTURE YELLOW 5 PAIRS (MISCELLANEOUS) ×1 IMPLANT
CLIP APPLIE 5 13 M/L LIGAMAX5 (MISCELLANEOUS) IMPLANT
CLIP LIGATING HEM O LOK PURPLE (MISCELLANEOUS) ×6 IMPLANT
CLIP LIGATING HEMO O LOK GREEN (MISCELLANEOUS) ×3 IMPLANT
CLIP LIGATING HEMOLOK MED (MISCELLANEOUS) IMPLANT
COVER TIP SHEARS 8 DVNC (MISCELLANEOUS) ×1 IMPLANT
COVER TIP SHEARS 8MM DA VINCI (MISCELLANEOUS) ×2
DECANTER SPIKE VIAL GLASS SM (MISCELLANEOUS) ×6 IMPLANT
DEVICE TROCAR PUNCTURE CLOSURE (ENDOMECHANICALS) IMPLANT
DRAIN CHANNEL 19F RND (DRAIN) IMPLANT
DRAPE ARM DVNC X/XI (DISPOSABLE) ×4 IMPLANT
DRAPE COLUMN DVNC XI (DISPOSABLE) ×1 IMPLANT
DRAPE DA VINCI XI ARM (DISPOSABLE) ×8
DRAPE DA VINCI XI COLUMN (DISPOSABLE) ×2
DRAPE WARM FLUID 44X44 (DRAPE) ×3 IMPLANT
DRSG TEGADERM 4X4.75 (GAUZE/BANDAGES/DRESSINGS) IMPLANT
DRSG TEGADERM 6X8 (GAUZE/BANDAGES/DRESSINGS) IMPLANT
ELECT PENCIL ROCKER SW 15FT (MISCELLANEOUS) ×3 IMPLANT
ELECT REM PT RETURN 15FT ADLT (MISCELLANEOUS) ×3 IMPLANT
EVACUATOR SILICONE 100CC (DRAIN) IMPLANT
FEMORAL VEIN GREATER THAN 30CM (Tissue) ×3 IMPLANT
GAUZE SPONGE 2X2 8PLY STRL LF (GAUZE/BANDAGES/DRESSINGS) ×1 IMPLANT
GAUZE SPONGE 4X4 12PLY STRL (GAUZE/BANDAGES/DRESSINGS) ×3 IMPLANT
GLOVE BIO SURGEON STRL SZ 6 (GLOVE) ×6 IMPLANT
GLOVE INDICATOR 6.5 STRL GRN (GLOVE) ×6 IMPLANT
GOWN STRL REUS W/TWL 2XL LVL3 (GOWN DISPOSABLE) ×6 IMPLANT
GOWN STRL REUS W/TWL XL LVL3 (GOWN DISPOSABLE) ×3 IMPLANT
HOLDER FOLEY CATH W/STRAP (MISCELLANEOUS) ×3 IMPLANT
KIT BASIN OR (CUSTOM PROCEDURE TRAY) ×6 IMPLANT
NEEDLE HYPO 22GX1.5 SAFETY (NEEDLE) ×3 IMPLANT
NEEDLE INSUFFLATION 14GA 120MM (NEEDLE) ×3 IMPLANT
PACK CARDIOVASCULAR III (CUSTOM PROCEDURE TRAY) ×3 IMPLANT
PACK GENERAL/GYN (CUSTOM PROCEDURE TRAY) ×3 IMPLANT
PAD ABD 8X10 STRL (GAUZE/BANDAGES/DRESSINGS) ×3 IMPLANT
PORT LAP GEL ALEXIS MED 5-9CM (MISCELLANEOUS) ×3 IMPLANT
POUCH SPECIMEN RETRIEVAL 10MM (ENDOMECHANICALS) IMPLANT
RELOAD STAPLER BLUE 60MM (STAPLE) ×1 IMPLANT
RELOAD STAPLER GOLD 60MM (STAPLE) IMPLANT
RELOAD STAPLER WHITE 60MM (STAPLE) ×1 IMPLANT
RELOAD WHITE ECR60W (STAPLE) ×24 IMPLANT
RTRCTR WOUND ALEXIS 18CM MED (MISCELLANEOUS)
SCISSORS LAP 5X35 DISP (ENDOMECHANICALS) IMPLANT
SEAL CANN UNIV 5-8 DVNC XI (MISCELLANEOUS) ×3 IMPLANT
SEAL XI 5MM-8MM UNIVERSAL (MISCELLANEOUS) ×6
SEALER VESSEL DA VINCI XI (MISCELLANEOUS)
SEALER VESSEL EXT DVNC XI (MISCELLANEOUS) IMPLANT
SET IRRIG TUBING LAPAROSCOPIC (IRRIGATION / IRRIGATOR) ×3 IMPLANT
SHEARS CURVED HARMONIC AC 45CM (MISCELLANEOUS) ×3 IMPLANT
SHEARS FOC LG CVD HARMONIC 17C (MISCELLANEOUS) ×3 IMPLANT
SLEEVE SURGEON STRL (DRAPES) IMPLANT
SLEEVE XCEL OPT CAN 5 100 (ENDOMECHANICALS) IMPLANT
SPONGE DRAIN TRACH 4X4 STRL 2S (GAUZE/BANDAGES/DRESSINGS) ×3 IMPLANT
SPONGE GAUZE 2X2 STER 10/PKG (GAUZE/BANDAGES/DRESSINGS) ×2
SPONGE LAP 18X18 X RAY DECT (DISPOSABLE) ×36 IMPLANT
STAPLE ECHEON FLEX 60 POW ENDO (STAPLE) ×3 IMPLANT
STAPLER 45 BLUE RELOAD SI (STAPLE) IMPLANT
STAPLER 45 GREEN RELOAD SI (STAPLE) IMPLANT
STAPLER CANNULA SEAL (CANNULA) IMPLANT
STAPLER RELOAD BLUE 60MM (STAPLE) ×3
STAPLER RELOAD GOLD 60MM (STAPLE)
STAPLER RELOAD WHITE 60MM (STAPLE) ×3
STAPLER SHEATH (SHEATH)
STAPLER SHEATH ENDOWRIST DVNC (SHEATH) IMPLANT
STAPLER VISISTAT 35W (STAPLE) ×3 IMPLANT
STRIP PERI DRY VERITAS 60 (STAPLE) ×6 IMPLANT
SUCTION POOLE TIP (SUCTIONS) ×3 IMPLANT
SUT ETHILON 2 0 PS N (SUTURE) ×3 IMPLANT
SUT MNCRL AB 4-0 PS2 18 (SUTURE) ×3 IMPLANT
SUT PDS AB 1 CTX 36 (SUTURE) IMPLANT
SUT PDS AB 1 TP1 96 (SUTURE) ×6 IMPLANT
SUT PDS AB 3-0 SH 27 (SUTURE) IMPLANT
SUT PDS AB 4-0 RB1 27 (SUTURE) IMPLANT
SUT PROLENE 2 0 SH DA (SUTURE) ×3 IMPLANT
SUT PROLENE 3 0 SH1 36 (SUTURE) IMPLANT
SUT PROLENE 4 0 RB 1 (SUTURE) ×12
SUT PROLENE 4-0 RB1 .5 CRCL 36 (SUTURE) ×4 IMPLANT
SUT PROLENE 5 0 CC 1 (SUTURE) ×12 IMPLANT
SUT SILK 0 (SUTURE) ×3
SUT SILK 0 30XBRD TIE 6 (SUTURE) ×1 IMPLANT
SUT VIC AB 2-0 SH 18 (SUTURE) ×3 IMPLANT
SUT VIC AB 3-0 SH 18 (SUTURE) ×3 IMPLANT
SUT VICRYL 0 TIES 12 18 (SUTURE) IMPLANT
SUT VICRYL 0 UR6 27IN ABS (SUTURE) IMPLANT
SUT VICRYL 2 0 18  UND BR (SUTURE) ×2
SUT VICRYL 2 0 18 UND BR (SUTURE) ×1 IMPLANT
SUT VICRYL 3 0 BR 18  UND (SUTURE) ×2
SUT VICRYL 3 0 BR 18 UND (SUTURE) ×1 IMPLANT
SYR 20CC LL (SYRINGE) ×2 IMPLANT
SYRINGE 10CC LL (SYRINGE) ×3 IMPLANT
SYRINGE 20CC LL (MISCELLANEOUS) ×3 IMPLANT
SYS LAPSCP GELPORT 120MM (MISCELLANEOUS)
SYSTEM LAPSCP GELPORT 120MM (MISCELLANEOUS) IMPLANT
TAG SUTURE CLAMP YLW 5PR (MISCELLANEOUS) ×1
TAPE CLOTH SURG 6X10 WHT LF (GAUZE/BANDAGES/DRESSINGS) ×3 IMPLANT
TOWEL OR 17X26 10 PK STRL BLUE (TOWEL DISPOSABLE) ×6 IMPLANT
TOWEL OR NON WOVEN STRL DISP B (DISPOSABLE) ×6 IMPLANT
TRAY FOLEY CATH 14FRSI W/METER (CATHETERS) ×3 IMPLANT
TRAY FOLEY CATH 16FRSI W/METER (SET/KITS/TRAYS/PACK) ×3 IMPLANT
TROCAR BLADELESS OPT 5 100 (ENDOMECHANICALS) IMPLANT
TROCAR XCEL 12X100 BLDLESS (ENDOMECHANICALS) ×3 IMPLANT
TROCAR XCEL BLUNT TIP 100MML (ENDOMECHANICALS) IMPLANT
TUBE FEEDING 5FR 36IN KANGAROO (TUBING) IMPLANT
TUBING FILTER THERMOFLATOR (ELECTROSURGICAL) ×3 IMPLANT

## 2014-09-04 ENCOUNTER — Ambulatory Visit (HOSPITAL_COMMUNITY): Payer: Medicare Other

## 2014-09-04 LAB — POCT I-STAT 7, (LYTES, BLD GAS, ICA,H+H)
Acid-base deficit: 14 mmol/L — ABNORMAL HIGH (ref 0.0–2.0)
Acid-base deficit: 15 mmol/L — ABNORMAL HIGH (ref 0.0–2.0)
Acid-base deficit: 19 mmol/L — ABNORMAL HIGH (ref 0.0–2.0)
BICARBONATE: 14.3 meq/L — AB (ref 20.0–24.0)
Bicarbonate: 13 meq/L — ABNORMAL LOW (ref 20.0–24.0)
Bicarbonate: 11 meq/L — ABNORMAL LOW (ref 20.0–24.0)
Calcium, Ion: 0.8 mmol/L — ABNORMAL LOW (ref 1.13–1.30)
Calcium, Ion: 0.84 mmol/L — ABNORMAL LOW (ref 1.13–1.30)
Calcium, Ion: 0.93 mmol/L — ABNORMAL LOW (ref 1.13–1.30)
HCT: 25 % — ABNORMAL LOW (ref 39.0–52.0)
HCT: 28 % — ABNORMAL LOW (ref 39.0–52.0)
HEMATOCRIT: 23 % — AB (ref 39.0–52.0)
HEMOGLOBIN: 7.8 g/dL — AB (ref 13.0–17.0)
Hemoglobin: 8.5 g/dL — ABNORMAL LOW (ref 13.0–17.0)
Hemoglobin: 9.5 g/dL — ABNORMAL LOW (ref 13.0–17.0)
O2 Saturation: 100 %
O2 Saturation: 100 %
O2 Saturation: 100 %
PCO2 ART: 41.3 mmHg (ref 35.0–45.0)
PH ART: 7.148 — AB (ref 7.350–7.450)
Potassium: 4.4 meq/L (ref 3.7–5.3)
Potassium: 4.7 mEq/L (ref 3.7–5.3)
Potassium: 5.6 meq/L — ABNORMAL HIGH (ref 3.7–5.3)
SODIUM: 139 meq/L (ref 137–147)
Sodium: 140 meq/L (ref 137–147)
Sodium: 140 meq/L (ref 137–147)
TCO2: 14 mmol/L (ref 0–100)
TCO2: 16 mmol/L (ref 0–100)
TCO2: 12 mmol/L (ref 0–100)
pCO2 arterial: 40.7 mmHg (ref 35.0–45.0)
pCO2 arterial: 44.3 mmHg (ref 35.0–45.0)
pH, Arterial: 7.114 — CL (ref 7.350–7.450)
pH, Arterial: 7.004 — CL (ref 7.350–7.450)
pO2, Arterial: 228 mmHg — ABNORMAL HIGH (ref 80.0–100.0)
pO2, Arterial: 267 mmHg — ABNORMAL HIGH (ref 80.0–100.0)
pO2, Arterial: 262 mmHg — ABNORMAL HIGH (ref 80.0–100.0)

## 2014-09-04 LAB — PREPARE FRESH FROZEN PLASMA
UNIT DIVISION: 0
UNIT DIVISION: 0
UNIT DIVISION: 0
UNIT DIVISION: 0
Unit division: 0

## 2014-09-04 LAB — PREPARE PLATELET PHERESIS: Unit division: 0

## 2014-09-04 LAB — PREPARE CRYOPRECIPITATE: Unit division: 0

## 2014-09-04 LAB — POCT I-STAT 4, (NA,K, GLUC, HGB,HCT)
Glucose, Bld: 253 mg/dL — ABNORMAL HIGH (ref 70–99)
HCT: 25 % — ABNORMAL LOW (ref 39.0–52.0)
HEMOGLOBIN: 8.5 g/dL — AB (ref 13.0–17.0)
Potassium: 5.1 mEq/L (ref 3.7–5.3)
Sodium: 139 mEq/L (ref 137–147)

## 2014-09-04 LAB — CORTISOL: CORTISOL PLASMA: 31.3 ug/dL

## 2014-09-04 NOTE — Progress Notes (Signed)
RT called to bedside to turn Vent off, Pt had expired.

## 2014-09-04 NOTE — Progress Notes (Signed)
Pt expired 09/17/2014 at [facility]. Expiration verified by two RNs Jimia Gentles Doles-Johnson, RN and Alexandria Lodge, RN. Family present at bedside. Body prepared and picked up by [employer] [employer] [facility] at 0240. Natale Milch, RN

## 2014-09-05 LAB — TYPE AND SCREEN
ABO/RH(D): A POS
ANTIBODY SCREEN: NEGATIVE
UNIT DIVISION: 0
UNIT DIVISION: 0
UNIT DIVISION: 0
UNIT DIVISION: 0
UNIT DIVISION: 0
UNIT DIVISION: 0
Unit division: 0
Unit division: 0
Unit division: 0
Unit division: 0
Unit division: 0
Unit division: 0

## 2014-09-13 NOTE — Discharge Summary (Addendum)
Physician Discharge Summary  Patient ID: [name] MRN: [id number] DOB/AGE: Jul 04, 1946 68 y.o.  Admit date: Oct 01, 2014 Discharge date: 09/13/2014  Admission Diagnoses: Pancreatic cancer  Discharge Diagnoses:  Active Problems:   Pancreatic cancer   Hemorrhagic shock   Metabolic acidosis   Respirator dependence Dissiminated intravascular coagulation Acute blood loss anemia with history of chronic iron deficiency anemia.  Discharged Condition: deceased  Hospital Course:  Pt was admitted to the ICU intubated after subtotal pancreatectomy with reconstruction of the portal vein for pancreatic cancer with high blood loss.  He was acidotic and hypotensive during the case, and this was attempted to be corrected.  He stabilized in the OR, but within 1-2 hours of being in the ICU, he was requiring increasing level of pressors, and he started to ooze from everywhere.  Blood products and factor VII were given to correct coagulopathy, but his acidosis did not correct despite bicarb gtt.  He got to the point that he was going to need dialysis, but was too unstable to travel.  His family elected to make DNR/DNI with no escalation of care.  He expired at [facility] on 09/18/2014.    Consults: pulmonary/intensive care  Significant Diagnostic Studies: labs: pH 6.918, bicarb 4.9, HCT 6 hours prior to TOD 23.6   Treatments: blood products, IVF, bicarb, surgery  Discharge Exam: Blood pressure 21/15, pulse 68, temperature 90 F (32.2 C), temperature source Core (Comment), resp. rate 22, height 5\' 7"  (1.702 m), weight 135 lb 5.8 oz (61.4 kg), SpO2 100.00%. General appearance: deceased Resp: no spontaneous breaths Cardio: no cardiac activity GI: soft, distended, dressing stained  Disposition: 20-Expired     Medication List    ASK your doctor about these medications       multivitamin with minerals Tabs tablet  Take 1 tablet by mouth every morning.     TYLENOL 500 MG tablet  Generic drug:   acetaminophen  Take 1,000 mg by mouth every 6 (six) hours as needed for mild pain or moderate pain.         SignedStark Klein 09/13/2014, 12:56 AM

## 2014-09-20 NOTE — Anesthesia Postprocedure Evaluation (Signed)
  Anesthesia Post-op Note  Patient: [name]  Procedure(s) Performed: Procedure(s) (LRB): DIAGNOSTIC LAPAROSCOPY, ROBOTIC CONVERTED TO OPEN SUBTOTAL PANCREATECTOMY WITH SPLENECTOMY WITH SUPERIOR MESENTERIC VEIN RECONSTRUCTION WITH CATAVERIC FEMEROL VEIN GRAFT (N/A)  Patient Location: ICU  Anesthesia Type: general  Level of Consciousness: sedated   Airway and Oxygen Therapy: intubated on ventilator  Post-op Pain: NA  Post-op Assessment: Critically ill with hypotension, severe acidosis. Transported to ICU. Dr. Barry Dienes has consulted CCM  Last Vitals:  Filed Vitals:   09-19-14 1940  BP:   Pulse: 96  Temp:   Resp: 22    Post-op Vital Signs: unstable   Complications: intraoperative bleeding, hypotension, acidosis

## 2014-09-20 NOTE — Transfer of Care (Signed)
Immediate Anesthesia Transfer of Care Note  Patient: [name]  Procedure(s) Performed: Procedure(s): DIAGNOSTIC LAPAROSCOPY, ROBOTIC CONVERTED TO OPEN SUBTOTAL PANCREATECTOMY WITH SPLENECTOMY WITH SUPERIOR MESENTERIC VEIN RECONSTRUCTION WITH CATAVERIC FEMEROL VEIN GRAFT (N/A)  Patient Location: ICU 1227  Anesthesia Type:General  Level of Consciousness: Patient easily awoken, sedated, comfortable, cooperative, following commands, responds to stimulation.   Airway & Oxygen Therapy: Patient spontaneously breathing, ventilating well, oxygen via simple oxygen mask.  Post-op Assessment: Report given to PACU RN, vital signs reviewed and stable, moving all extremities.   Post vital signs: Reviewed and stable.  Complications: No apparent anesthesia complications

## 2014-09-20 NOTE — Progress Notes (Signed)
eLink Physician-Brief Progress Note Patient Name: [name] DOB: Oct 24, 1946 MRN: [id number]   Date of Service  September 29, 2014  HPI/Events of Note  Severe acidosis from shock\blood loss\?DIC,   eICU Interventions  3amp bicarb, inc bicarb gtt to 150cc\hr Surgery having discussion with family     Intervention Category Major Interventions: Acid-Base disturbance - evaluation and management  [name] 2014-09-29, 8:59 PM
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
## 2014-09-20 NOTE — Progress Notes (Signed)
eLink Physician-Brief Progress Note Patient Name: [name] DOB: [birth year] MRN: [id number]   Date of Service  2014-09-15  HPI/Events of Note  Patient with severe acidosis, and hypotension, at this time family has decided on DNR\DNI and no escalation of care  eICU Interventions  Orders entered.         [name] 09-15-2014, 10:39 PM

## 2014-09-20 NOTE — Anesthesia Preprocedure Evaluation (Addendum)
Anesthesia Evaluation  Patient identified by MRN, date of birth, ID band Patient awake    Reviewed: Allergy & Precautions, H&P , NPO status , Patient's Chart, lab work & pertinent test results  Airway Mallampati: II TM Distance: >3 FB Neck ROM: Full    Dental  (+) Edentulous Upper, Missing   Pulmonary Current Smoker,  breath sounds clear to auscultation  Pulmonary exam normal       Cardiovascular negative cardio ROS  Rhythm:Regular Rate:Normal     Neuro/Psych PSYCHIATRIC DISORDERS Anxiety Traumatic brain injury; mental retardation  Weakness right side of body. Walks with a cane.    GI/Hepatic negative GI ROS, Neg liver ROS,   Endo/Other  negative endocrine ROS  Renal/GU negative Renal ROS  negative genitourinary   Musculoskeletal  (+) Arthritis -,   Abdominal   Peds negative pediatric ROS (+)  Hematology  (+) anemia ,   Anesthesia Other Findings   Reproductive/Obstetrics negative OB ROS                         Anesthesia Physical Anesthesia Plan  ASA: III  Anesthesia Plan: General   Post-op Pain Management:    Induction: Intravenous  Airway Management Planned: Oral ETT  Additional Equipment:   Intra-op Plan:   Post-operative Plan: Extubation in OR  Informed Consent: I have reviewed the patients History and Physical, chart, labs and discussed the procedure including the risks, benefits and alternatives for the proposed anesthesia with the patient or authorized representative who has indicated his/her understanding and acceptance.   Dental advisory given  Plan Discussed with: CRNA  Anesthesia Plan Comments: (2 Intravenous lines. Blood bracelet on .)       Anesthesia Quick Evaluation

## 2014-09-20 NOTE — Progress Notes (Signed)
Chaplain paged to tend to family. Present were his three sisters, son-in-law and daughter of one of the sisters.  Patient Trust has 3 living sisters of ten total children.  6 siblings have already died.  He was the youngest son.  Patient never married and has no children.  Extended family (per youngest sister) is very close  Patient was in surgery from 10am to 5pm, for the removal of a tumor in the pancreas liver area and the associated veins upon which it was imbedded.  He has continued to bleed from the areas that where cutting took place and from the incision (per Psychologist, sport and exercise).  Additionally, the doctor shared the reason for bleeding was the suppression of active clotting factors due to acidosis.  The bleeding could not be stopped surgically, but rather chemically, and this was the course the staff was taking.   The family is of strong faith, though not the patient as much as his sisters.  The chaplain was able to get all the attending family in with the patient (as they had been restricted to 2 at a time) for prayer with everyone, inclduing the patient.  It seemed a meaningful moment.  The doctor's immediate prognosis when she spoke to the family, was that the patient was stabilizing, and the bleeding was slowing, thus indicating that what was added to his system was beginning to work.  The patient remains in a serious state.  There is no talk of palliative intervention.  With the doctor's talk, the family felt better.  They were urged to go home, rest up, sleep, eat... As there would be no change in the patient's consciousness level because they were keeping him sedated.  Chaplain stayed for the doctor's talk with permission of the family.  Loann Quill, Chaplain Pager: [phone]

## 2014-09-20 NOTE — Op Note (Signed)
PREOPERATIVE DIAGNOSIS:  Pancreatic mass of body of the pancreas.      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     PROCEDURE:  Diagnostic laparoscopy, robotic to open subtotal pancreatectomy with portal vein reconstruction.       SURGEON:  Stark Klein, MD      ASSISTANT:  Leighton Ruff, MD     ANESTHESIA:  General and local.      FINDINGS:  Solid mass in body of pancreas.  Adherent to splenoportal confluence.  .       SPECIMEN:  Distal pancreas and spleento Pathology.      ESTIMATED BLOOD LOSS:  9528 mL      COMPLICATIONS:  None known.      PROCEDURE:  Patient was identified in the holding area and taken to   operating room where he was placed supine on the operating room table.   General anesthesia was induced.  Foley catheter was placed.  He was   placed in the leaning spleen position.  His abdomen was prepped and   draped in sterile fashion.  Time-out was performed according to surgical   safety check list.  When all was correct, we continued.    The RUQ skin was infiltrated with local and an incision was made with a #11 blade.  The Veress needle was used to access the abdomen.    A penetrating towel clip was used to elevate the skin.  Pneumoperitoneum was achieved.       Three additional robotic trocars were placed in a crescent in the left abdomen and right upper abdomen.  A 12 mm trocar was placed in the lower midline. A 5 mm port was placed in the upper abdomen.   The lesser sac was opened with the Vessel sealer and all the adhesions were taken down.  The adhesions of the spleen to the abdominal wall were also taken down.  The lower border of the pancreas was opened up and the adhesions were taken down.  The retroperitoneum was opened up above the pancreas. What appeared to be the IMV was taken down and clipped. Unfortunately, this created a defect in the splenic vein.  The vein branch was attempted to be stapled, but the  stapler could not get a good angle, and so it was removed.  The robotic arms were used to clamp the bleeding.        The robot was undocked.  A hand port was created.  Unfortunately, the bleeding could not be controlled.  The incision was extended.  Laps were packed and pressure was placed.  The portal vein defect was closed and several other areas of bleeding were controlled.  It became apparent that this would require a vein graft in order to restore venous blood flow to the liver.    This was sewn in with running 4-0 prolene.  The doppler was used to make sure there was arterial flow to the porta.  The pancreas was sent for frozen section.  Margin was benign.     Tisseal was placed over the pancreatic edge.  The 19 Blake drain was then passed into the abdomen through a medial port and pulled out through one of the other trocars.  This was placed in the appropriate location and secured with a 2-0 nylon.  The fascia was closed with running #1 looped PDS suture.  The The skin of all the incisions was   then closed with staples.  The  patient was hypotensive and got multiple units of blood.  He also required pressors.  He was taken to the ICU ventilated.    Needle, sponge, and instrument counts were correct x2               Stark Klein, MD
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
## 2014-09-20 NOTE — Progress Notes (Addendum)
Patient was a direct admit to ICU from [place] at 1740. Patient on ventilator, O2 98%, BP 86/63(69) with Neo gtt wide open on arrival from OR, unable to obtain a temp, NG noted to have bloody drainage, abdominal dressing saturated with blood, pupils noted to be dilated (unsure of status pre and intra op) Neo gtt continued, levophed started, 1 Liter bolus of NaCl given. MULTIPLE calls made to Dr. Barry Dienes and CCM for updates, assistance. Per Dr. Barry Dienes massive transfusion orders started. Albumin given, Bicarb gtt started. Orders for cryo and FFP obtained (per blood bank cryo to come from another facility). Patient remains hypotensive, bleeding profusely from JP, NG tube, and abdominal dressing. Family arrived and given update. Emotional support given.

## 2014-09-20 DEATH — deceased

## 2016-04-24 IMAGING — CT CT ABD-PELV W/O CM
2 of 4 series · 17 of 46 positions shown, 19 images · IV contrast (READICAT/WATER &)
Comparison: None.

CLINICAL DATA: Mid abdominal pain for 1 week. Recent diarrhea.
Seven weight loss. History of a hemicolectomy.

EXAM:
CT ABDOMEN AND PELVIS WITHOUT CONTRAST
TECHNIQUE: Multidetector CT imaging of the abdomen and pelvis was performed
following the standard protocol without IV contrast.

[Series 2: abd/pelvis without · axial · non-contrast · 0.64mm/px · z∈[-482,-66]mm · 14 of 91 slices shown, 16 images]
[im 4/91  soft-tissue]
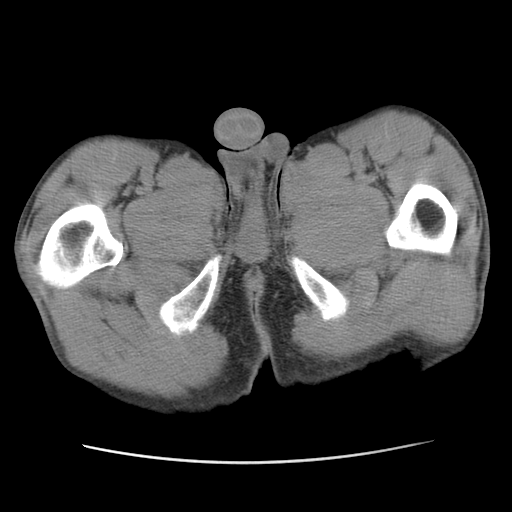
[im 4/91  bone]
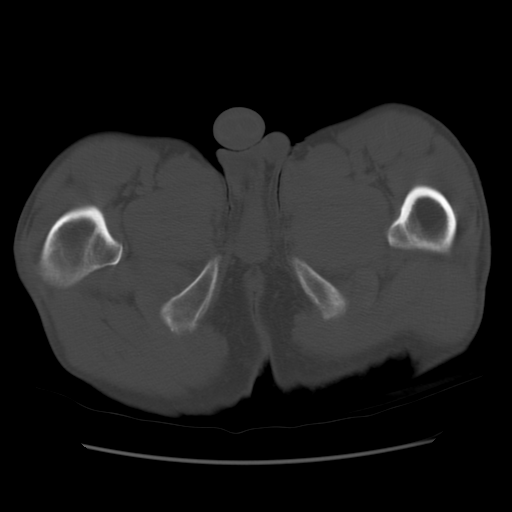
[im 12/91  soft-tissue]
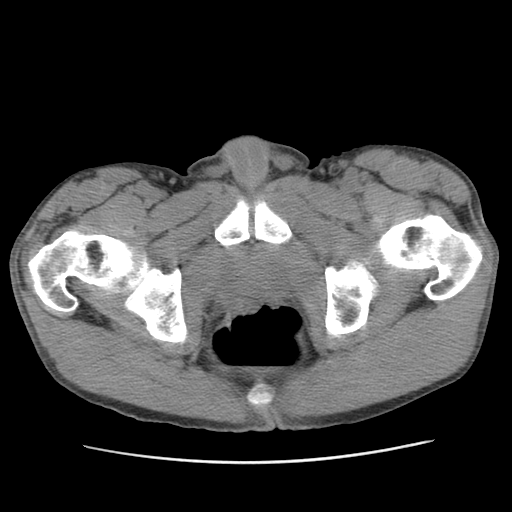
[im 16/91  soft-tissue]
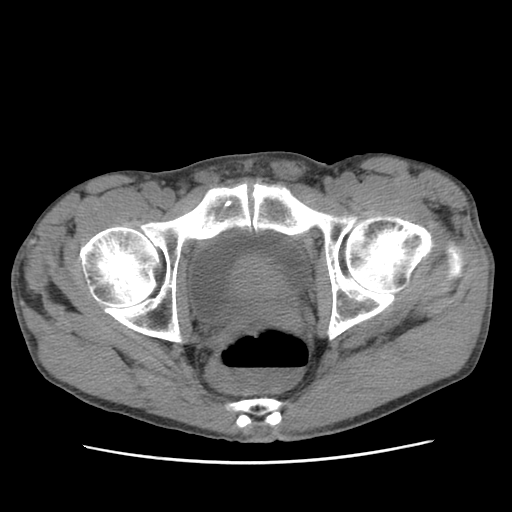
[im 24/91  soft-tissue]
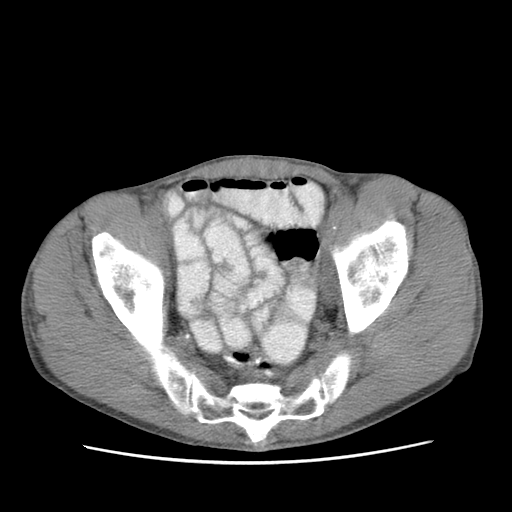
[im 32/91  soft-tissue]
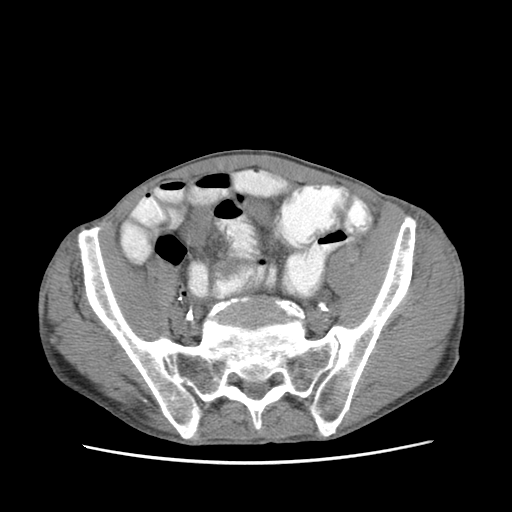
[im 36/91  soft-tissue]
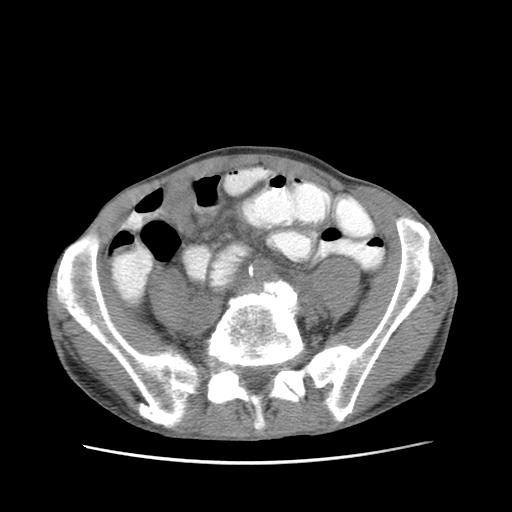
[im 44/91  soft-tissue]
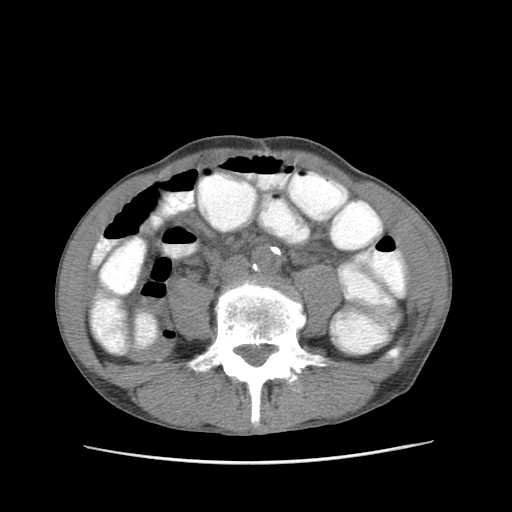
[im 47/91  soft-tissue]
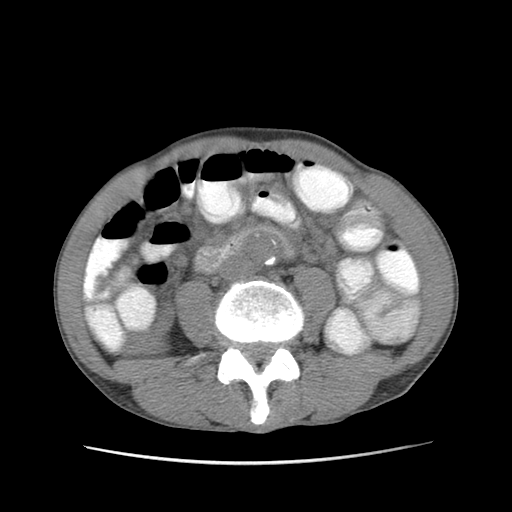
[im 55/91  soft-tissue]
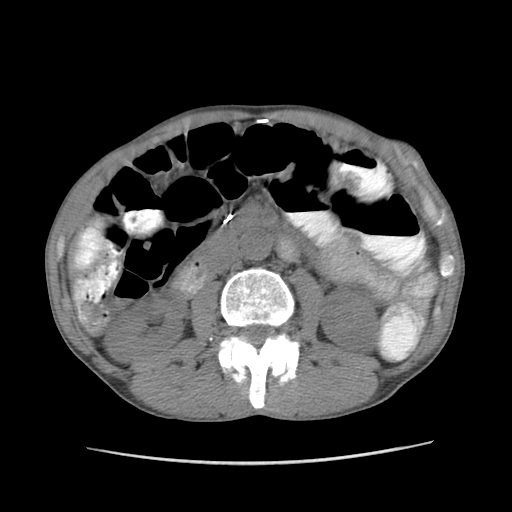
[im 55/91  bone]
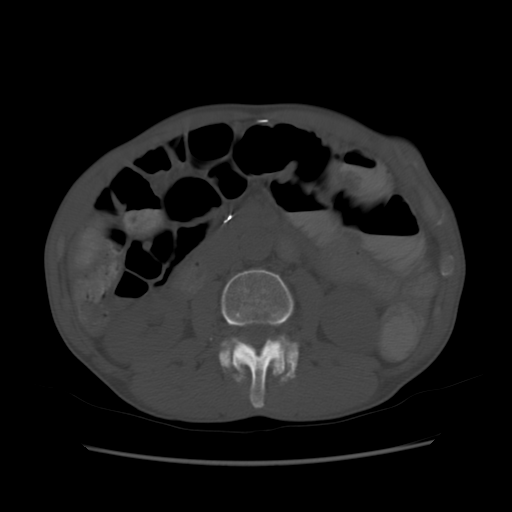
[im 59/91  soft-tissue]
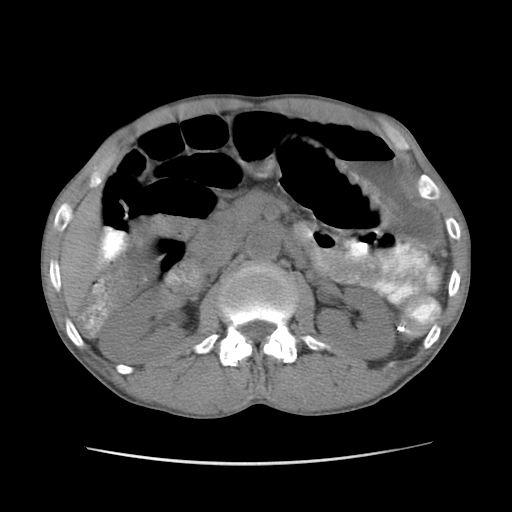
[im 67/91  soft-tissue]
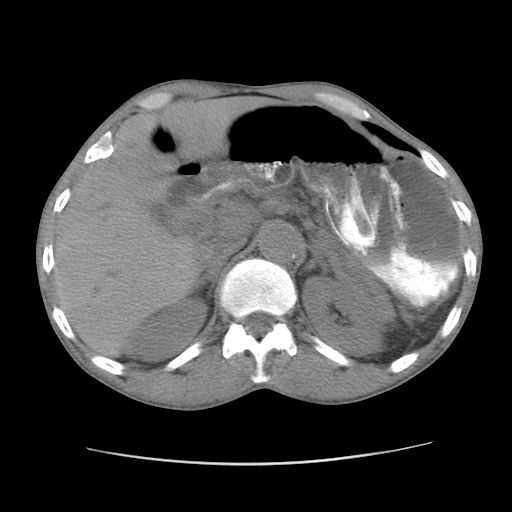
[im 75/91  soft-tissue]
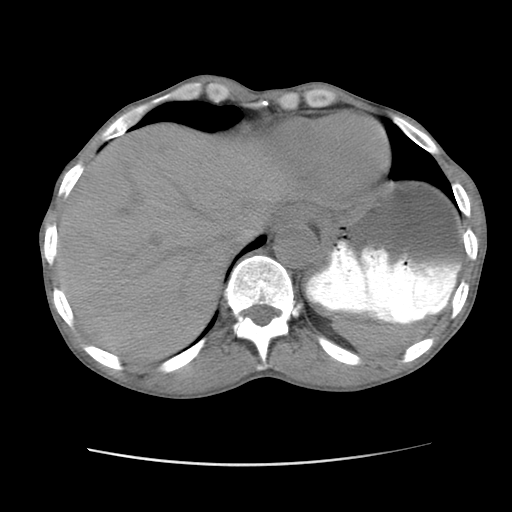
[im 79/91  soft-tissue]
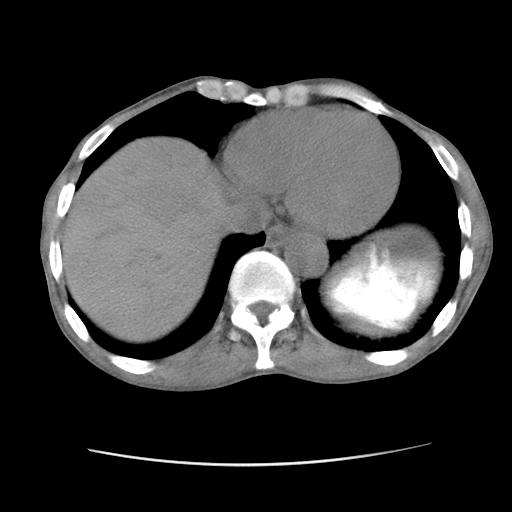
[im 87/91  soft-tissue]
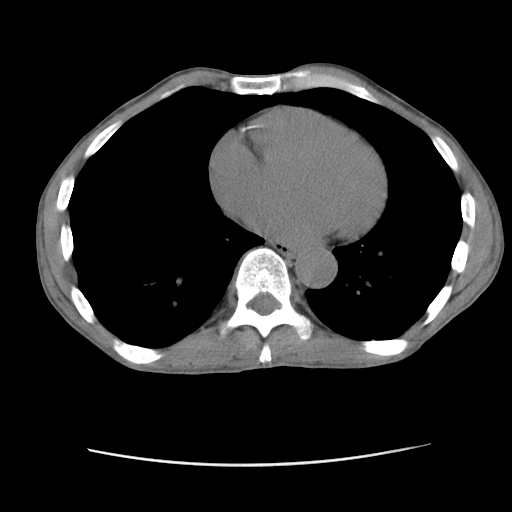

[Series 400: cor · coronal · 0.90mm/px · 3 of 121 slices shown]
[im 41/121  soft-tissue]
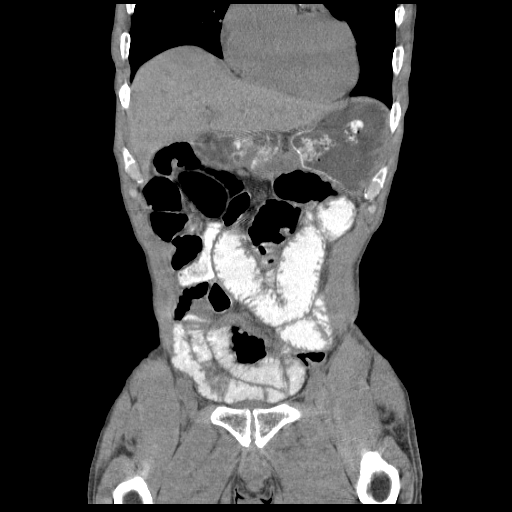
[im 54/121  soft-tissue]
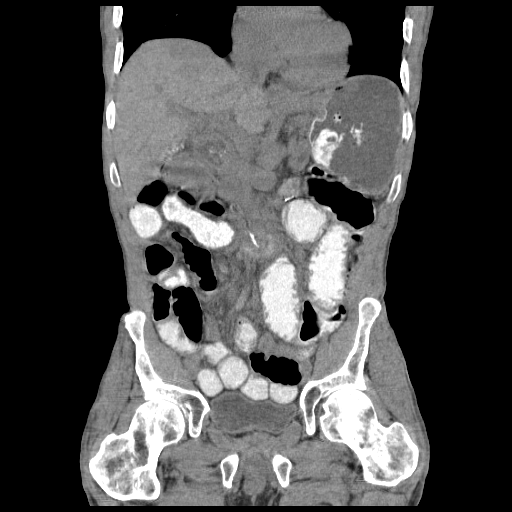
[im 67/121  soft-tissue]
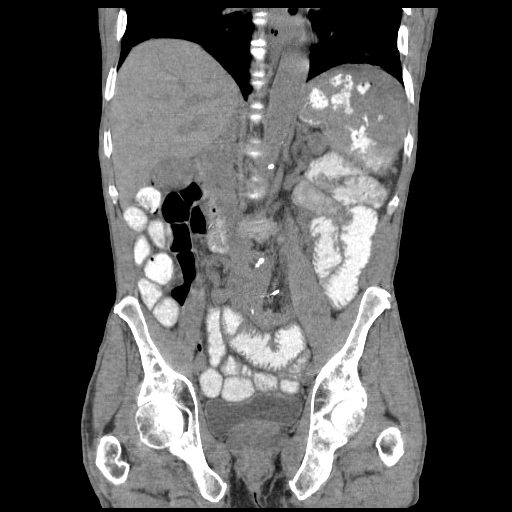

[17 of 46 positions shown; findings below may reference images not displayed]

FINDINGS: There are dilated loops of small bowel, with a maximum diameter of
3.8 cm. Or distal small bowel is normal in caliber. Exact transition
point is not defined on this exam. There is an anastomosis staple
line at the expected rectosigmoid junction. Colon is not well
defined above this.

Mild lung base reticular opacities likely subsegmental atelectasis,
scarring or a combination. Heart is normal in size.

There is a low-attenuation area at the dome of the liver measuring
4.7 cm. This is nonspecific. Liver is otherwise unremarkable.

Spleen is small but otherwise unremarkable.

Gallstones. Gallbladder is mildly distended. No evidence of acute
cholecystitis.

Pancreas is unremarkable. No bile duct dilation. No adrenal masses.
Kidneys, ureters and bladder are unremarkable.

No discrete enlarged lymph nodes are seen.  No ascites.

There are degenerative changes of the far spine. No osteoblastic or
osteolytic lesions.
IMPRESSION: 1. Findings support a low grade partial small bowel obstruction,
with the transition point not clear on this study. No other evidence
of an acute abnormality.
2. 4.7 cm hypoattenuating lesion at the dome of the liver,
nonspecific. Recommend followup liver MRI with and without contrast
for further characterization.
